# Patient Record
Sex: Male | Born: 1965 | Race: White | Hispanic: No | Smoking: Current every day smoker
Health system: Southern US, Community
[De-identification: ages and names within clinical notes are randomized; demographics above are authoritative.]

## PROBLEM LIST (undated history)

## (undated) DIAGNOSIS — I10 Essential (primary) hypertension: Secondary | ICD-10-CM

## (undated) DIAGNOSIS — E079 Disorder of thyroid, unspecified: Secondary | ICD-10-CM

---

## 2006-09-02 ENCOUNTER — Inpatient Hospital Stay (HOSPITAL_COMMUNITY): Admission: RE | Admit: 2006-09-02 | Discharge: 2006-09-06 | Payer: Self-pay | Admitting: Psychiatry

## 2006-09-02 ENCOUNTER — Emergency Department (HOSPITAL_COMMUNITY): Admission: EM | Admit: 2006-09-02 | Discharge: 2006-09-02 | Payer: Self-pay | Admitting: Emergency Medicine

## 2006-09-02 ENCOUNTER — Ambulatory Visit: Payer: Self-pay | Admitting: Psychiatry

## 2012-09-07 ENCOUNTER — Other Ambulatory Visit (HOSPITAL_COMMUNITY): Payer: Self-pay

## 2012-10-06 ENCOUNTER — Ambulatory Visit: Payer: BC Managed Care – PPO | Attending: Neurology | Admitting: Sleep Medicine

## 2012-10-06 DIAGNOSIS — G4730 Sleep apnea, unspecified: Secondary | ICD-10-CM

## 2012-10-06 DIAGNOSIS — Z6835 Body mass index (BMI) 35.0-35.9, adult: Secondary | ICD-10-CM | POA: Insufficient documentation

## 2012-10-06 DIAGNOSIS — G4733 Obstructive sleep apnea (adult) (pediatric): Secondary | ICD-10-CM | POA: Insufficient documentation

## 2012-10-21 NOTE — Procedures (Signed)
[facility] Maimouna Rondeau A. Gerilyn Pilgrim, MD     [email or web address]        NAMEJADIE, COMAS                   ACCOUNT NO.:  [id number]  MEDICAL RECORD NO.:  [id number]          PATIENT TYPE:  OUT  LOCATION:  SLEEP LAB                     FACILITY:  [facility]  PHYSICIAN:  Sheikh Leverich A. Gerilyn Pilgrim, M.D. DATE OF BIRTH:  January 07, 1966  DATE OF STUDY:  10/06/2012                           NOCTURNAL POLYSOMNOGRAM  REFERRING PHYSICIAN:  Kirklin Mcduffee A. Gerilyn Pilgrim, M.D.  INDICATION:  A 47 year old man, who presents with fatigue, hypersomnia, and witnessed apnea.  MEDICATIONS:  Lisinopril, Prilosec, levothyroxine, Xanax, and Paxil.  EPWORTH SLEEPINESS SCALE:  6.  BMI 35.  ARCHITECTURAL SUMMARY:  The total recording time is 388 minutes.  Sleep efficiency 50%.  Sleep latency 81 minutes.  REM latency 0 minutes. Stage N1 is 30%, N2 is 70%, N3 is 0%, and REM sleep 0%.  RESPIRATORY SUMMARY:  Baseline oxygen saturation is 97, lowest saturation 92.  Diagnostic AHI is 83 and RDI of 84.  LIMB MOVEMENT SUMMARY:  PLM index 0.  ELECTROCARDIOGRAM SUMMARY:  Average heart rate is 82 with no significant dysrhythmias observed.  IMPRESSION: 1. Severe obstructive sleep apnea syndrome. 2. Abnormal architecture with reduced sleep efficiency.  Absent slow     wave sleep and absent REM sleep.  RECOMMENDATION:  The patient should be set up for formal CPAP titration recording.     Ragan Reale A. Gerilyn Pilgrim, M.D.    KAD/MEDQ  D:  10/21/2012 10:40:31  T:  10/21/2012 11:57:00  Job:  [id number]

## 2012-11-09 ENCOUNTER — Other Ambulatory Visit: Payer: Self-pay | Admitting: Neurology

## 2012-11-09 DIAGNOSIS — G4733 Obstructive sleep apnea (adult) (pediatric): Secondary | ICD-10-CM

## 2012-11-10 ENCOUNTER — Ambulatory Visit: Payer: BC Managed Care – PPO | Attending: Neurology | Admitting: Sleep Medicine

## 2012-11-10 DIAGNOSIS — Z6835 Body mass index (BMI) 35.0-35.9, adult: Secondary | ICD-10-CM | POA: Insufficient documentation

## 2012-11-10 DIAGNOSIS — G4733 Obstructive sleep apnea (adult) (pediatric): Secondary | ICD-10-CM | POA: Insufficient documentation

## 2012-11-17 NOTE — Procedures (Signed)
[facility] [name], MD     [email or web address]        [name], [name]                   ACCOUNT NO.:  [id number]  MEDICAL RECORD NO.:  [id number]          PATIENT TYPE:  OUT  LOCATION:  SLEEP LAB                     FACILITY:  [facility]  PHYSICIAN:  Jonquil Stubbe A. Gerilyn Pilgrim, M.D. DATE OF BIRTH:  Aug 07, 1965  DATE OF STUDY:  11/10/2012                           NOCTURNAL POLYSOMNOGRAM  REFERRING PHYSICIAN:  Leeam Cedrone A. Gerilyn Pilgrim, M.D.  INDICATION FOR STUDY:  A 47 year old man who had a previous study documenting significant obstructive sleep apnea syndrome.  EPWORTH SLEEPINESS SCORE:  12.  BMI 35.  MEDICATIONS:  Xanax, levothyroxine, lisinopril, Paxil, Prilosec.  SLEEP ARCHITECTURE:  The total recording time is 119 minutes, sleep efficiency 40%, sleep latency 9 minutes, REM latency 0.  Study is abbreviated because the patient had several panic attacks, although he was desensitized before the study with CPAP equipment.  He simply could not tolerate the mask during the several attempts that were made throughout the recording.  In the meantime, he did have low saturation of 77 during non-REM sleep.  Baseline saturation 93.  He was tried on different pressors between 5 and 9, but again had multiple episodes of panic attack and could not tolerate the titration pressures.   IMPRESSIONS-RECOMMENDATIONS:  Unsuccessful titration and abbreviated study due to intolerance of CPAP with multiple episodes of panic attacks.     [name], M.D.   KAD/MEDQ  D:  11/17/2012 18:50:00  T:  11/17/2012 19:00:01  Job:  [id number]

## 2018-05-02 ENCOUNTER — Ambulatory Visit: Payer: Self-pay | Admitting: Psychiatry

## 2018-05-02 DIAGNOSIS — F329 Major depressive disorder, single episode, unspecified: Secondary | ICD-10-CM

## 2018-05-02 DIAGNOSIS — F32A Depression, unspecified: Secondary | ICD-10-CM

## 2018-05-02 DIAGNOSIS — G4700 Insomnia, unspecified: Secondary | ICD-10-CM

## 2018-05-02 DIAGNOSIS — F411 Generalized anxiety disorder: Secondary | ICD-10-CM

## 2018-05-02 MED ORDER — PAROXETINE HCL 20 MG PO TABS: 20.0000 mg | ORAL_TABLET | Freq: Every day | ORAL | 1 refills | Status: DC

## 2018-05-02 MED ORDER — TRAZODONE HCL 50 MG PO TABS: 100.0000 mg | ORAL_TABLET | Freq: Every day | ORAL | 1 refills | Status: DC

## 2018-05-02 NOTE — Progress Notes (Signed)
[facility]  Patient ID: Jacob Mullen,  MRN: [id number]  PCP: Ignatius Specking, MD  Date of Evaluation: 05/02/2018 Time spent:20 minutes   HISTORY/CURRENT STATUS: HPI 52 year old white male seen initially 03/31/2018.  Diagnosis depression, anxiety, insomnia, increased alcohol intake and hx.of drug use. At that time we increased his Paxil to 20 mg a day.  Started him on naltrexone and NAC for decrease alcohol consumption.  Advised to start a B complex vitamin.  Start melatonin for sleep.  He was also encouraged to decrease etoh and cigarettes. At this time patient does not have depression or anxiety.  He still complains of insomnia, melatonin did not help.  Continues to drink 3 or 4 beers a day 4 days a week to help with insomnia.  Did not start naltrexone.   Individual Medical History/ Review of Systems: Changes? :No  Allergies: Patient has no allergy information on record.  Current Medications:  Current Outpatient Medications:  .  PARoxetine (PAXIL) 20 MG tablet, Take 1 tablet (20 mg total) by mouth daily., Disp: 30 tablet, Rfl: 1 .  naltrexone (DEPADE) 50 MG tablet, Take 50 mg by mouth daily., Disp: , Rfl:  .  traZODone (DESYREL) 50 MG tablet, Take 2 tablets (100 mg total) by mouth at bedtime., Disp: 60 tablet, Rfl: 1 Medication Side Effects: None  Family Medical/ Social History: Changes? No  MENTAL HEALTH EXAM:  There were no vitals taken for this visit.There is no height or weight on file to calculate BMI.  General Appearance: Casual  Eye Contact:  Good  Speech:  Normal Rate  Volume:  Normal  Mood:  Euthymic  Affect:  Appropriate  Thought Process:  Linear  Orientation:  Full (Time, Place, and Person)  Thought Content: WDL   Suicidal Thoughts:  No  Homicidal Thoughts:  No  Memory:  normal  Judgement:  Good  Insight:  Good  Psychomotor Activity:  Normal  Concentration:  Concentration: Good  Recall:  Good  Fund of Knowledge: Good  Language: Good  Akathisia:   NA  AIMS (if indicated): na  Assets:  Desire for Improvement  ADL's:  Intact  Cognition: WNL  Prognosis:  Good    DIAGNOSES:    ICD-10-CM   1. Depression, unspecified depression type F32.9   2. Anxiety state F41.1   3. Insomnia, unspecified type G47.00     RECOMMENDATIONS: We continue Paxil 20 mg.  He is also started on trazodone 50 mg 1-2 at bedtime.  Advised to drink less.  We will to wait on naltrexone.  Will make sure that he is using B complex vitamin in the future.  He will return in 4 to 6 weeks.    Anne Fu, PA-C

## 2018-05-08 ENCOUNTER — Other Ambulatory Visit: Payer: Self-pay | Admitting: Psychiatry

## 2018-05-08 MED ORDER — PAROXETINE HCL 20 MG PO TABS: 20.0000 mg | ORAL_TABLET | Freq: Every day | ORAL | 1 refills | Status: DC

## 2018-05-24 ENCOUNTER — Other Ambulatory Visit: Payer: Self-pay

## 2018-05-24 MED ORDER — TRAZODONE HCL 50 MG PO TABS: 100.0000 mg | ORAL_TABLET | Freq: Every day | ORAL | 0 refills | Status: AC

## 2018-06-14 ENCOUNTER — Ambulatory Visit (INDEPENDENT_AMBULATORY_CARE_PROVIDER_SITE_OTHER): Payer: Self-pay | Admitting: Psychiatry

## 2018-06-14 DIAGNOSIS — F32A Depression, unspecified: Secondary | ICD-10-CM

## 2018-06-14 DIAGNOSIS — F329 Major depressive disorder, single episode, unspecified: Secondary | ICD-10-CM

## 2018-06-14 DIAGNOSIS — G4700 Insomnia, unspecified: Secondary | ICD-10-CM

## 2018-06-14 DIAGNOSIS — F411 Generalized anxiety disorder: Secondary | ICD-10-CM

## 2018-06-14 MED ORDER — MIRTAZAPINE 15 MG PO TABS: 30.0000 mg | ORAL_TABLET | Freq: Every day | ORAL | 0 refills | Status: AC

## 2018-06-14 MED ORDER — PAROXETINE HCL 30 MG PO TABS: ORAL_TABLET | ORAL | 0 refills | Status: DC

## 2018-06-14 NOTE — Progress Notes (Signed)
[facility]  Patient ID: Jacob Mullen,  MRN: [id number]  PCP: Ignatius SpeckingVyas, Dhruv B, MD  Date of Evaluation: 06/14/2018 Time spent:20 minutes  Chief Complaint:   HISTORY/CURRENT STATUS: HPI  Patient seen 05/02/2018 with diagnosis of depression (mood disorder), anxiety, insomnia, increased alcohol intake, history of drug use. Depression and anxiety were better with still having insomnia. Continue to his Paxil 20 mg a day and started him on trazodone for sleep.  Patient here today states he is depressed he has increased stress he denies crying spells but admits to isolation.  Insomnia is the same.  Anhedonia.  No suicidal thoughts. Anxiety is okay. Insomnia-patient still not sleeping well using Z quill and trazodone 100 250 at bedtime He is to decrease his alcohol 2 beers several days a week.  None since Thanksgiving.  Individual Medical History/ Review of Systems: Changes? no  Allergies: Codeine  Current Medications:  Current Outpatient Medications:  .  traZODone (DESYREL) 50 MG tablet, Take 2 tablets (100 mg total) by mouth at bedtime., Disp: 180 tablet, Rfl: 0 .  mirtazapine (REMERON) 15 MG tablet, Take 2 tablets (30 mg total) by mouth at bedtime., Disp: 60 tablet, Rfl: 0 .  PARoxetine (PAXIL) 30 MG tablet, 1 per day, Disp: 30 tablet, Rfl: 0 Medication Side Effects: none  Family Medical/ Social History: Changes?no  MENTAL HEALTH EXAM:  There were no vitals taken for this visit.There is no height or weight on file to calculate BMI.  General Appearance: Casual  Eye Contact:  Good  Speech:  Clear and Coherent  Volume:  Normal  Mood:  Depressed  Affect:  Appropriate  Thought Process:  Goal Directed  Orientation:  Full (Time, Place, and Person)  Thought Content: Logical   Suicidal Thoughts:  No  Homicidal Thoughts:  No  Memory:  WNL  Judgement:  Good  Insight:  Good  Psychomotor Activity:  Normal  Concentration:  Concentration: Good  Recall:  Good  Fund of  Knowledge: Good  Language: Good  Assets:  Desire for Improvement  ADL's:  Intact  Cognition: WNL  Prognosis:  Good    DIAGNOSES:    ICD-10-CM   1. Depression, unspecified depression type F32.9   2. Anxiety state F41.1   3. Insomnia, unspecified type G47.00     Receiving Psychotherapy: No    RECOMMENDATIONS: Patient is to increase his Paxil to 30 mg a day.  Patient is to take trazodone 50 mg a day for 1 week and then stop.  Patient to start Remeron 15 mg 1 to 2 tablets at bedtime.  At his next visit we will remind the patient to take B complex vitamins. Return in 1 month.   Anne Fulay Avni Traore, PA-C

## 2018-07-13 ENCOUNTER — Ambulatory Visit: Payer: Self-pay | Admitting: Psychiatry

## 2018-07-19 ENCOUNTER — Encounter (HOSPITAL_COMMUNITY): Payer: Self-pay

## 2018-07-19 ENCOUNTER — Emergency Department (HOSPITAL_COMMUNITY)
Admission: EM | Admit: 2018-07-19 | Discharge: 2018-07-20 | Disposition: A | Discharge status: Home / Self Care | Payer: Medicaid Other | Attending: Emergency Medicine | Admitting: Emergency Medicine

## 2018-07-19 DIAGNOSIS — Z79899 Other long term (current) drug therapy: Secondary | ICD-10-CM | POA: Insufficient documentation

## 2018-07-19 DIAGNOSIS — I10 Essential (primary) hypertension: Secondary | ICD-10-CM | POA: Insufficient documentation

## 2018-07-19 DIAGNOSIS — E079 Disorder of thyroid, unspecified: Secondary | ICD-10-CM | POA: Insufficient documentation

## 2018-07-19 DIAGNOSIS — R17 Unspecified jaundice: Secondary | ICD-10-CM | POA: Diagnosis present

## 2018-07-19 DIAGNOSIS — D649 Anemia, unspecified: Secondary | ICD-10-CM

## 2018-07-19 DIAGNOSIS — R945 Abnormal results of liver function studies: Secondary | ICD-10-CM | POA: Insufficient documentation

## 2018-07-19 DIAGNOSIS — R748 Abnormal levels of other serum enzymes: Secondary | ICD-10-CM

## 2018-07-19 DIAGNOSIS — F17210 Nicotine dependence, cigarettes, uncomplicated: Secondary | ICD-10-CM | POA: Diagnosis not present

## 2018-07-19 HISTORY — DX: Essential (primary) hypertension: I10

## 2018-07-19 HISTORY — DX: Disorder of thyroid, unspecified: E07.9

## 2018-07-19 LAB — URINALYSIS, ROUTINE W REFLEX MICROSCOPIC
Glucose, UA: 50 mg/dL — AB
Ketones, ur: NEGATIVE mg/dL
Leukocytes, UA: NEGATIVE
Nitrite: NEGATIVE
PH: 6 (ref 5.0–8.0)
Protein, ur: 30 mg/dL — AB
Specific Gravity, Urine: 1.017 (ref 1.005–1.030)

## 2018-07-19 LAB — COMPREHENSIVE METABOLIC PANEL
ALT: 51 U/L — ABNORMAL HIGH (ref 0–44)
AST: 98 U/L — ABNORMAL HIGH (ref 15–41)
Albumin: 2.6 g/dL — ABNORMAL LOW (ref 3.5–5.0)
Alkaline Phosphatase: 162 U/L — ABNORMAL HIGH (ref 38–126)
Anion gap: 9 (ref 5–15)
BILIRUBIN TOTAL: 26.8 mg/dL — AB (ref 0.3–1.2)
BUN: 5 mg/dL — ABNORMAL LOW (ref 6–20)
CO2: 24 mmol/L (ref 22–32)
Calcium: 9.1 mg/dL (ref 8.9–10.3)
Chloride: 99 mmol/L (ref 98–111)
Creatinine, Ser: 0.71 mg/dL (ref 0.61–1.24)
GFR calc Af Amer: >60 mL/min (ref >60)
GFR calc non Af Amer: >60 mL/min (ref >60)
Glucose, Bld: 121 mg/dL — ABNORMAL HIGH (ref 70–99)
POTASSIUM: 3.5 mmol/L (ref 3.5–5.1)
Sodium: 132 mmol/L — ABNORMAL LOW (ref 135–145)
TOTAL PROTEIN: 7.7 g/dL (ref 6.5–8.1)

## 2018-07-19 LAB — CBC
HCT: 36.1 % — ABNORMAL LOW (ref 39.0–52.0)
Hemoglobin: 12.5 g/dL — ABNORMAL LOW (ref 13.0–17.0)
MCH: 34 pg (ref 26.0–34.0)
MCHC: 34.6 g/dL (ref 30.0–36.0)
MCV: 98.1 fL (ref 80.0–100.0)
Platelets: 46 10*3/uL — ABNORMAL LOW (ref 150–400)
RBC: 3.68 MIL/uL — ABNORMAL LOW (ref 4.22–5.81)
RDW: 14.4 % (ref 11.5–15.5)
WBC: 4.9 10*3/uL (ref 4.0–10.5)
nRBC: 0 % (ref 0.0–0.2)

## 2018-07-19 LAB — LIPASE, BLOOD: Lipase: 45 U/L (ref 11–51)

## 2018-07-19 NOTE — ED Provider Notes (Signed)
[name] EMERGENCY DEPARTMENT Provider Note   CSN: [id number] Arrival date & time: 07/19/18  1947     History   Chief Complaint Chief Complaint  Patient presents with  . Jaundice    HPI Jacob Mullen is a 53 y.o. male.  The history is provided by the patient.  He has history of hypertension and thyroid disease as well as excessive alcohol use and comes in with yellow eyes and dark urine for the last 2 weeks.  He states that his appetite has been fine.  He denies any abdominal pain or nausea.  He has been a little bit weaker than normal.  He denies constipation or diarrhea.  He does relate that he takes paroxetine, and the dose was increased from 10 mg a day to 20 mg a day the beginning of December.  He admits to drinking 4 or 5 12 ounce beers a day.  He does relate that when he stops drinking, he feels shaky for 1 or 2 days, but has never had a seizure and is never been admitted for alcohol withdrawal.  He denies other drug use.  He denies fever chills.  Past Medical History:  Diagnosis Date  . Hypertension   . Thyroid disease     There are no active problems to display for this patient.   History reviewed. No pertinent surgical history.      Home Medications    Prior to Admission medications   Medication Sig Start Date End Date Taking? Authorizing Provider  mirtazapine (REMERON) 15 MG tablet Take 2 tablets (30 mg total) by mouth at bedtime. 06/14/18   Shugart, Mat Carne, PA-C  PARoxetine (PAXIL) 30 MG tablet 1 per day 06/14/18   Anne Fu, PA-C  traZODone (DESYREL) 50 MG tablet Take 2 tablets (100 mg total) by mouth at bedtime. 05/24/18   Anne Fu, PA-C    Family History No family history on file.  Social History Social History   Tobacco Use  . Smoking status: Current Every Day Smoker    Packs/day: 0.50  Substance Use Topics  . Alcohol use: Yes  . Drug use: Not on file     Allergies   Codeine   Review of Systems Review of Systems    All other systems reviewed and are negative.    Physical Exam Updated Vital Signs BP 131/76 (BP Location: Left Arm)   Pulse 88   Temp 98.2 F (36.8 C) (Oral)   Resp 14   SpO2 96%   Physical Exam Vitals signs and nursing note reviewed.    53 year old male, resting comfortably and in no acute distress. Vital signs are normal. Oxygen saturation is 96%, which is normal. Head is normocephalic and atraumatic. PERRLA, EOMI. Oropharynx is clear.  Intense scleral icterus is noted. Neck is nontender and supple without adenopathy or JVD. Back is nontender and there is no CVA tenderness. Lungs are clear without rales, wheezes, or rhonchi. Chest is nontender. Heart has regular rate and rhythm without murmur. Abdomen is soft, flat, nontender without masses or hepatosplenomegaly and peristalsis is normoactive.  Liver size is normal.  No right upper quadrant tenderness. Extremities have no cyanosis or edema, full range of motion is present. Skin is warm and dry without rash. Neurologic: Mental status is normal, cranial nerves are intact, there are no motor or sensory deficits.  ED Treatments / Results  Labs (all labs ordered are listed, but only abnormal results are displayed) Labs Reviewed  COMPREHENSIVE METABOLIC  PANEL - Abnormal; Notable for the following components:      Result Value   Sodium 132 (*)    Glucose, Bld 121 (*)    BUN 5 (*)    Albumin 2.6 (*)    AST 98 (*)    ALT 51 (*)    Alkaline Phosphatase 162 (*)    Total Bilirubin 26.8 (*)    All other components within normal limits  CBC - Abnormal; Notable for the following components:   RBC 3.68 (*)    Hemoglobin 12.5 (*)    HCT 36.1 (*)    Platelets 46 (*)    All other components within normal limits  URINALYSIS, ROUTINE W REFLEX MICROSCOPIC - Abnormal; Notable for the following components:   Color, Urine AMBER (*)    APPearance CLOUDY (*)    Glucose, UA 50 (*)    Hgb urine dipstick SMALL (*)    Bilirubin Urine  MODERATE (*)    Protein, ur 30 (*)    Bacteria, UA MANY (*)    Non Squamous Epithelial 0-5 (*)    All other components within normal limits  LIPASE, BLOOD   Radiology [place] Abdomen Limited  Result Date: 07/20/2018 CLINICAL DATA:  Jaundice. EXAM: ULTRASOUND ABDOMEN LIMITED RIGHT UPPER QUADRANT COMPARISON:  None. FINDINGS: Gallbladder: Partially distended and irregular in shape. No gallstones or wall thickening visualized. No sonographic Murphy sign noted by sonographer. Common bile duct: Diameter: 4 mm, normal. Liver: No focal lesion identified. Heterogeneous and increased in parenchymal echogenicity. Suggestion of mild micro nodular contours. Portal vein is patent on color Doppler imaging with normal direction of blood flow towards the liver. Small amount perihepatic ascites. IMPRESSION: 1. Heterogeneous increased hepatic echogenicity suggesting steatosis/chronic hepatocellular disease. Mild micro nodular contours of the liver suggests cirrhosis. Trace perihepatic ascites. 2. Gallbladder is partially distended, and irregular in shape, this may be due to chronic gallbladder disease. No gallstones or wall thickening. No biliary dilatation. Electronically Signed   By: Narda RutherfordMelanie  Sanford M.D.   On: 07/20/2018 01:51    Procedures Procedures  Medications Ordered in ED Medications - No data to display   Initial Impression / Assessment and Plan / ED Course  I have reviewed the triage vital signs and the nursing notes.  Pertinent labs & imaging results that were available during my care of the patient were reviewed by me and considered in my medical decision making (see chart for details).  Jaundice which is probably multifactorial.  Labs show bilirubin of 26.8, but transaminases are under 100, and alkaline phosphatase is only mildly elevated to 162.  Mild anemia is present which is normochromic and normocytic, no prior hemoglobin available for comparison.  Will check total bilirubin and send blood for  hepatitis studies.  Suspect combination of ethanol with drug-induced jaundiced.  Since he is eating well, no indication for hospitalization at this point.  Will check right upper quadrant ultrasound to look for evidence of hepatic duct obstruction.  Anticipate referral to gastroenterology.  Old records were reviewed, and he has no relevant past visits.  Ultrasound shows no evidence of obstruction.  He is advised to discontinue peroxide teen, since symptoms started shortly after a change in the dose of that medication.  He is advised to abstain from alcohol and acetaminophen.  He is referred to gastroenterology for further outpatient work-up.  Final Clinical Impressions(s) / ED Diagnoses   Final diagnoses:  Jaundice  Normochromic normocytic anemia  Elevated liver enzymes    ED Discharge Orders  None       Dione BoozeGlick, Bobby Barton, MD 07/20/18 0230

## 2018-07-19 NOTE — ED Triage Notes (Signed)
Pt reports yellow skin and eyes since christmas, pt states he does drink about 4 beers daily and has been a daily drinker for about 10 years. Pt denies abd pain, n/v.

## 2018-07-20 ENCOUNTER — Emergency Department (HOSPITAL_COMMUNITY): Payer: Medicaid Other

## 2018-07-20 LAB — BILIRUBIN, DIRECT: BILIRUBIN DIRECT: 16.2 mg/dL — AB (ref 0.0–0.2)

## 2018-07-20 NOTE — ED Notes (Signed)
thw pt went to ultrasound 0130 and returned to his room 0145

## 2018-07-20 NOTE — ED Notes (Signed)
The pt is c/o l;ooking jaundice for several weeks  No pain anywhere

## 2018-07-20 NOTE — Discharge Instructions (Addendum)
Do not drink anything with alcohol in it!  Do not  take any acetaminophen!  Stop taking Paxil.  Return if you are having any problems.  Your blood tests for hepatitis will not be available for several days - talk with your primary care provider or gastroenterologist to interpret the results.

## 2018-07-21 LAB — HEPATITIS PANEL, ACUTE
HCV Ab: 0.2 s/co ratio (ref 0.0–0.9)
Hep A IgM: NEGATIVE
Hep B C IgM: NEGATIVE
Hepatitis B Surface Ag: NEGATIVE

## 2018-07-28 ENCOUNTER — Telehealth (HOSPITAL_COMMUNITY): Payer: Self-pay

## 2018-07-31 ENCOUNTER — Telehealth: Payer: Self-pay | Admitting: Nurse Practitioner

## 2018-07-31 NOTE — Telephone Encounter (Signed)
Can we accept him as a new patient?  

## 2018-08-01 NOTE — Telephone Encounter (Signed)
Pt's mother is aware of OV tomorrow

## 2018-08-01 NOTE — Telephone Encounter (Signed)
No previous GI provider in the system. Ok to accept.

## 2018-08-02 ENCOUNTER — Inpatient Hospital Stay (HOSPITAL_COMMUNITY): Payer: Medicaid Other

## 2018-08-02 ENCOUNTER — Other Ambulatory Visit: Payer: Self-pay

## 2018-08-02 ENCOUNTER — Ambulatory Visit: Payer: Self-pay | Admitting: Gastroenterology

## 2018-08-02 ENCOUNTER — Inpatient Hospital Stay (HOSPITAL_COMMUNITY)
Admission: EM | Admit: 2018-08-02 | Discharge: 2018-08-12 | DRG: 432 | Disposition: E | Payer: Medicaid Other | Attending: Pulmonary Disease | Admitting: Pulmonary Disease

## 2018-08-02 DIAGNOSIS — N179 Acute kidney failure, unspecified: Secondary | ICD-10-CM | POA: Diagnosis not present

## 2018-08-02 DIAGNOSIS — Z452 Encounter for adjustment and management of vascular access device: Secondary | ICD-10-CM

## 2018-08-02 DIAGNOSIS — K7041 Alcoholic hepatic failure with coma: Secondary | ICD-10-CM | POA: Diagnosis present

## 2018-08-02 DIAGNOSIS — F419 Anxiety disorder, unspecified: Secondary | ICD-10-CM | POA: Diagnosis present

## 2018-08-02 DIAGNOSIS — A419 Sepsis, unspecified organism: Secondary | ICD-10-CM

## 2018-08-02 DIAGNOSIS — J690 Pneumonitis due to inhalation of food and vomit: Secondary | ICD-10-CM | POA: Diagnosis present

## 2018-08-02 DIAGNOSIS — K652 Spontaneous bacterial peritonitis: Secondary | ICD-10-CM

## 2018-08-02 DIAGNOSIS — D638 Anemia in other chronic diseases classified elsewhere: Secondary | ICD-10-CM | POA: Diagnosis present

## 2018-08-02 DIAGNOSIS — F329 Major depressive disorder, single episode, unspecified: Secondary | ICD-10-CM | POA: Diagnosis present

## 2018-08-02 DIAGNOSIS — Z515 Encounter for palliative care: Secondary | ICD-10-CM | POA: Diagnosis present

## 2018-08-02 DIAGNOSIS — Z66 Do not resuscitate: Secondary | ICD-10-CM | POA: Diagnosis present

## 2018-08-02 DIAGNOSIS — K7011 Alcoholic hepatitis with ascites: Secondary | ICD-10-CM | POA: Diagnosis present

## 2018-08-02 DIAGNOSIS — K210 Gastro-esophageal reflux disease with esophagitis: Secondary | ICD-10-CM | POA: Diagnosis present

## 2018-08-02 DIAGNOSIS — K766 Portal hypertension: Secondary | ICD-10-CM | POA: Diagnosis present

## 2018-08-02 DIAGNOSIS — K920 Hematemesis: Secondary | ICD-10-CM | POA: Diagnosis present

## 2018-08-02 DIAGNOSIS — N170 Acute kidney failure with tubular necrosis: Secondary | ICD-10-CM | POA: Diagnosis present

## 2018-08-02 DIAGNOSIS — G9341 Metabolic encephalopathy: Secondary | ICD-10-CM | POA: Diagnosis present

## 2018-08-02 DIAGNOSIS — K7031 Alcoholic cirrhosis of liver with ascites: Secondary | ICD-10-CM | POA: Diagnosis present

## 2018-08-02 DIAGNOSIS — R6521 Severe sepsis with septic shock: Secondary | ICD-10-CM

## 2018-08-02 DIAGNOSIS — E440 Moderate protein-calorie malnutrition: Secondary | ICD-10-CM | POA: Diagnosis present

## 2018-08-02 DIAGNOSIS — E875 Hyperkalemia: Secondary | ICD-10-CM | POA: Diagnosis present

## 2018-08-02 DIAGNOSIS — R4182 Altered mental status, unspecified: Secondary | ICD-10-CM

## 2018-08-02 DIAGNOSIS — D688 Other specified coagulation defects: Secondary | ICD-10-CM | POA: Diagnosis present

## 2018-08-02 DIAGNOSIS — Z978 Presence of other specified devices: Secondary | ICD-10-CM

## 2018-08-02 DIAGNOSIS — D696 Thrombocytopenia, unspecified: Secondary | ICD-10-CM | POA: Diagnosis present

## 2018-08-02 DIAGNOSIS — K228 Other specified diseases of esophagus: Secondary | ICD-10-CM | POA: Diagnosis present

## 2018-08-02 DIAGNOSIS — K767 Hepatorenal syndrome: Secondary | ICD-10-CM | POA: Diagnosis present

## 2018-08-02 DIAGNOSIS — K7290 Hepatic failure, unspecified without coma: Secondary | ICD-10-CM

## 2018-08-02 DIAGNOSIS — E079 Disorder of thyroid, unspecified: Secondary | ICD-10-CM | POA: Diagnosis present

## 2018-08-02 DIAGNOSIS — R34 Anuria and oliguria: Secondary | ICD-10-CM | POA: Diagnosis present

## 2018-08-02 DIAGNOSIS — F10288 Alcohol dependence with other alcohol-induced disorder: Secondary | ICD-10-CM | POA: Diagnosis present

## 2018-08-02 DIAGNOSIS — F17210 Nicotine dependence, cigarettes, uncomplicated: Secondary | ICD-10-CM | POA: Diagnosis present

## 2018-08-02 DIAGNOSIS — Z4659 Encounter for fitting and adjustment of other gastrointestinal appliance and device: Secondary | ICD-10-CM

## 2018-08-02 DIAGNOSIS — E873 Alkalosis: Secondary | ICD-10-CM | POA: Diagnosis present

## 2018-08-02 DIAGNOSIS — Z6828 Body mass index (BMI) 28.0-28.9, adult: Secondary | ICD-10-CM

## 2018-08-02 DIAGNOSIS — K7201 Acute and subacute hepatic failure with coma: Secondary | ICD-10-CM | POA: Diagnosis not present

## 2018-08-02 DIAGNOSIS — J9690 Respiratory failure, unspecified, unspecified whether with hypoxia or hypercapnia: Secondary | ICD-10-CM

## 2018-08-02 DIAGNOSIS — K7682 Hepatic encephalopathy: Secondary | ICD-10-CM | POA: Diagnosis present

## 2018-08-02 DIAGNOSIS — E871 Hypo-osmolality and hyponatremia: Secondary | ICD-10-CM | POA: Diagnosis present

## 2018-08-02 DIAGNOSIS — Z789 Other specified health status: Secondary | ICD-10-CM

## 2018-08-02 DIAGNOSIS — Z79899 Other long term (current) drug therapy: Secondary | ICD-10-CM

## 2018-08-02 DIAGNOSIS — K7040 Alcoholic hepatic failure without coma: Secondary | ICD-10-CM | POA: Diagnosis present

## 2018-08-02 DIAGNOSIS — Z7189 Other specified counseling: Secondary | ICD-10-CM | POA: Diagnosis not present

## 2018-08-02 DIAGNOSIS — J9601 Acute respiratory failure with hypoxia: Secondary | ICD-10-CM | POA: Diagnosis present

## 2018-08-02 DIAGNOSIS — R578 Other shock: Secondary | ICD-10-CM | POA: Diagnosis not present

## 2018-08-02 DIAGNOSIS — Z885 Allergy status to narcotic agent status: Secondary | ICD-10-CM

## 2018-08-02 DIAGNOSIS — I10 Essential (primary) hypertension: Secondary | ICD-10-CM | POA: Diagnosis present

## 2018-08-02 LAB — POCT I-STAT 7, (LYTES, BLD GAS, ICA,H+H)
Acid-base deficit: 2 mmol/L (ref 0.0–2.0)
Bicarbonate: 20 mmol/L (ref 20.0–28.0)
Calcium, Ion: 0.92 mmol/L — ABNORMAL LOW (ref 1.15–1.40)
HCT: 30 % — ABNORMAL LOW (ref 39.0–52.0)
Hemoglobin: 10.2 g/dL — ABNORMAL LOW (ref 13.0–17.0)
O2 Saturation: 100 %
Patient temperature: 97.5
Potassium: 4.7 mmol/L (ref 3.5–5.1)
SODIUM: 124 mmol/L — AB (ref 135–145)
TCO2: 21 mmol/L — ABNORMAL LOW (ref 22–32)
pCO2 arterial: 24.9 mmHg — ABNORMAL LOW (ref 32.0–48.0)
pH, Arterial: 7.51 — ABNORMAL HIGH (ref 7.350–7.450)
pO2, Arterial: 364 mmHg — ABNORMAL HIGH (ref 83.0–108.0)

## 2018-08-02 LAB — BASIC METABOLIC PANEL
Anion gap: 19 — ABNORMAL HIGH (ref 5–15)
Anion gap: 20 — ABNORMAL HIGH (ref 5–15)
Anion gap: 27 — ABNORMAL HIGH (ref 5–15)
BUN: 64 mg/dL — ABNORMAL HIGH (ref 6–20)
BUN: 70 mg/dL — ABNORMAL HIGH (ref 6–20)
BUN: 71 mg/dL — ABNORMAL HIGH (ref 6–20)
BUN: 75 mg/dL — ABNORMAL HIGH (ref 6–20)
CALCIUM: 8.4 mg/dL — AB (ref 8.9–10.3)
CALCIUM: 7.2 mg/dL — AB (ref 8.9–10.3)
CHLORIDE: 85 mmol/L — AB (ref 98–111)
CO2: 11 mmol/L — ABNORMAL LOW (ref 22–32)
CO2: 16 mmol/L — ABNORMAL LOW (ref 22–32)
CO2: 16 mmol/L — ABNORMAL LOW (ref 22–32)
CO2: 14 mmol/L — ABNORMAL LOW (ref 22–32)
CREATININE: 4.91 mg/dL — AB (ref 0.61–1.24)
CREATININE: 6.46 mg/dL — AB (ref 0.61–1.24)
CREATININE: 6.84 mg/dL — AB (ref 0.61–1.24)
Calcium: 7.3 mg/dL — ABNORMAL LOW (ref 8.9–10.3)
Calcium: 8.2 mg/dL — ABNORMAL LOW (ref 8.9–10.3)
Chloride: 86 mmol/L — ABNORMAL LOW (ref 98–111)
Chloride: 85 mmol/L — ABNORMAL LOW (ref 98–111)
Chloride: 86 mmol/L — ABNORMAL LOW (ref 98–111)
Creatinine, Ser: 6.45 mg/dL — ABNORMAL HIGH (ref 0.61–1.24)
GFR calc Af Amer: 14 mL/min — ABNORMAL LOW (ref >60)
GFR calc Af Amer: 10 mL/min — ABNORMAL LOW (ref >60)
GFR calc Af Amer: 10 mL/min — ABNORMAL LOW (ref >60)
GFR calc Af Amer: 10 mL/min — ABNORMAL LOW (ref >60)
GFR calc non Af Amer: 12 mL/min — ABNORMAL LOW (ref >60)
GFR calc non Af Amer: 9 mL/min — ABNORMAL LOW (ref >60)
GFR calc non Af Amer: 9 mL/min — ABNORMAL LOW (ref >60)
GFR calc non Af Amer: 8 mL/min — ABNORMAL LOW (ref >60)
Glucose, Bld: 120 mg/dL — ABNORMAL HIGH (ref 70–99)
Glucose, Bld: 85 mg/dL (ref 70–99)
Glucose, Bld: 84 mg/dL (ref 70–99)
Glucose, Bld: 91 mg/dL (ref 70–99)
Potassium: 5.5 mmol/L — ABNORMAL HIGH (ref 3.5–5.1)
Potassium: >7.5 mmol/L (ref 3.5–5.1)
Potassium: >7.5 mmol/L (ref 3.5–5.1)
Potassium: 4 mmol/L (ref 3.5–5.1)
SODIUM: 121 mmol/L — AB (ref 135–145)
Sodium: 122 mmol/L — ABNORMAL LOW (ref 135–145)
Sodium: 120 mmol/L — ABNORMAL LOW (ref 135–145)
Sodium: 127 mmol/L — ABNORMAL LOW (ref 135–145)

## 2018-08-02 LAB — GLUCOSE, CAPILLARY
Glucose-Capillary: 83 mg/dL (ref 70–99)
Glucose-Capillary: 77 mg/dL (ref 70–99)
Glucose-Capillary: 117 mg/dL — ABNORMAL HIGH (ref 70–99)
Glucose-Capillary: 83 mg/dL (ref 70–99)

## 2018-08-02 LAB — COMPREHENSIVE METABOLIC PANEL
ALK PHOS: 141 U/L — AB (ref 38–126)
ALT: 54 U/L — ABNORMAL HIGH (ref 0–44)
AST: 98 U/L — ABNORMAL HIGH (ref 15–41)
Albumin: 2.2 g/dL — ABNORMAL LOW (ref 3.5–5.0)
BUN: 65 mg/dL — ABNORMAL HIGH (ref 6–20)
CO2: 13 mmol/L — ABNORMAL LOW (ref 22–32)
Calcium: 8.4 mg/dL — ABNORMAL LOW (ref 8.9–10.3)
Chloride: 84 mmol/L — ABNORMAL LOW (ref 98–111)
Creatinine, Ser: 5.64 mg/dL — ABNORMAL HIGH (ref 0.61–1.24)
GFR calc Af Amer: 12 mL/min — ABNORMAL LOW (ref >60)
GFR calc non Af Amer: 10 mL/min — ABNORMAL LOW (ref >60)
Glucose, Bld: 122 mg/dL — ABNORMAL HIGH (ref 70–99)
Potassium: 3.9 mmol/L (ref 3.5–5.1)
Sodium: 122 mmol/L — ABNORMAL LOW (ref 135–145)
TOTAL PROTEIN: 6.7 g/dL (ref 6.5–8.1)
Total Bilirubin: 40.7 mg/dL (ref 0.3–1.2)

## 2018-08-02 LAB — CBC WITH DIFFERENTIAL/PLATELET
Abs Immature Granulocytes: 0.46 10*3/uL — ABNORMAL HIGH (ref 0.00–0.07)
Basophils Absolute: 0.1 10*3/uL (ref 0.0–0.1)
Basophils Relative: 0 %
EOS PCT: 0 %
Eosinophils Absolute: 0 10*3/uL (ref 0.0–0.5)
HCT: 28.8 % — ABNORMAL LOW (ref 39.0–52.0)
Hemoglobin: 10 g/dL — ABNORMAL LOW (ref 13.0–17.0)
Immature Granulocytes: 2 %
Lymphocytes Relative: 4 %
Lymphs Abs: 0.8 10*3/uL (ref 0.7–4.0)
MCH: 34.4 pg — ABNORMAL HIGH (ref 26.0–34.0)
MCHC: 34.7 g/dL (ref 30.0–36.0)
MCV: 99 fL (ref 80.0–100.0)
Monocytes Absolute: 2.2 10*3/uL — ABNORMAL HIGH (ref 0.1–1.0)
Monocytes Relative: 11 %
Neutro Abs: 16.6 10*3/uL — ABNORMAL HIGH (ref 1.7–7.7)
Neutrophils Relative %: 83 %
Platelets: 85 10*3/uL — ABNORMAL LOW (ref 150–400)
RBC: 2.91 MIL/uL — ABNORMAL LOW (ref 4.22–5.81)
RDW: 15.5 % (ref 11.5–15.5)
WBC: 20.1 10*3/uL — ABNORMAL HIGH (ref 4.0–10.5)
nRBC: 0 % (ref 0.0–0.2)

## 2018-08-02 LAB — ACETAMINOPHEN LEVEL: Acetaminophen (Tylenol), Serum: <10 ug/mL — ABNORMAL LOW (ref 10–30)

## 2018-08-02 LAB — LACTIC ACID, PLASMA
LACTIC ACID, VENOUS: 10.7 mmol/L — AB (ref 0.5–1.9)
Lactic Acid, Venous: 8.9 mmol/L (ref 0.5–1.9)
Lactic Acid, Venous: 10.6 mmol/L (ref 0.5–1.9)

## 2018-08-02 LAB — PROTIME-INR
INR: 3.08
INR: 3.43
PROTHROMBIN TIME: 34 s — AB (ref 11.4–15.2)
Prothrombin Time: 31.3 seconds — ABNORMAL HIGH (ref 11.4–15.2)

## 2018-08-02 LAB — ABO/RH: ABO/RH(D): O POS

## 2018-08-02 LAB — SODIUM, URINE, RANDOM: Sodium, Ur: <10 mmol/L

## 2018-08-02 LAB — PROCALCITONIN: Procalcitonin: 2.7 ng/mL

## 2018-08-02 LAB — CREATININE, URINE, RANDOM: Creatinine, Urine: 272.27 mg/dL

## 2018-08-02 LAB — MAGNESIUM: Magnesium: 2 mg/dL (ref 1.7–2.4)

## 2018-08-02 LAB — CORTISOL: Cortisol, Plasma: 21.8 ug/dL

## 2018-08-02 LAB — CBG MONITORING, ED: Glucose-Capillary: 114 mg/dL — ABNORMAL HIGH (ref 70–99)

## 2018-08-02 LAB — PROTEIN / CREATININE RATIO, URINE
Creatinine, Urine: 270.56 mg/dL
Protein Creatinine Ratio: 0.41 mg/mg{Cre} — ABNORMAL HIGH (ref 0.00–0.15)
Total Protein, Urine: 110 mg/dL

## 2018-08-02 LAB — AMMONIA: Ammonia: 324 umol/L — ABNORMAL HIGH (ref 9–35)

## 2018-08-02 LAB — PHOSPHORUS: Phosphorus: 11.3 mg/dL — ABNORMAL HIGH (ref 2.5–4.6)

## 2018-08-02 LAB — OSMOLALITY: Osmolality: 302 mOsm/kg — ABNORMAL HIGH (ref 275–295)

## 2018-08-02 LAB — OSMOLALITY, URINE: Osmolality, Ur: 314 mOsm/kg (ref 300–900)

## 2018-08-02 LAB — LIPASE, BLOOD: Lipase: 86 U/L — ABNORMAL HIGH (ref 11–51)

## 2018-08-02 LAB — AMYLASE: Amylase: 331 U/L — ABNORMAL HIGH (ref 28–100)

## 2018-08-02 MED ORDER — FENTANYL CITRATE (PF) 100 MCG/2ML IJ SOLN
100.0000 ug | INTRAMUSCULAR | Status: AC | PRN
  Administered 2018-08-02 – 2018-08-04 (×3): 100 ug via INTRAVENOUS
  Filled 2018-08-02 (×2): qty 2

## 2018-08-02 MED ORDER — SODIUM BICARBONATE 8.4 % IV SOLN
50.0000 meq | Freq: Once | INTRAVENOUS | Status: AC
  Administered 2018-08-02: 50 meq via INTRAVENOUS
  Filled 2018-08-02: qty 50

## 2018-08-02 MED ORDER — LORAZEPAM 2 MG/ML IJ SOLN
0.5000 mg | Freq: Once | INTRAMUSCULAR | Status: AC
  Administered 2018-08-02: 0.5 mg via INTRAVENOUS
  Filled 2018-08-02: qty 1

## 2018-08-02 MED ORDER — SODIUM CHLORIDE 0.9 % IV BOLUS
500.0000 mL | Freq: Once | INTRAVENOUS | Status: AC
  Administered 2018-08-02: 500 mL via INTRAVENOUS

## 2018-08-02 MED ORDER — RIFAXIMIN 550 MG PO TABS
550.0000 mg | ORAL_TABLET | Freq: Two times a day (BID) | ORAL | Status: DC
  Administered 2018-08-02 – 2018-08-06 (×7): 550 mg
  Filled 2018-08-02 (×8): qty 1

## 2018-08-02 MED ORDER — INSULIN ASPART 100 UNIT/ML IV SOLN
10.0000 [IU] | Freq: Once | INTRAVENOUS | Status: AC
  Administered 2018-08-02: 10 [IU] via INTRAVENOUS

## 2018-08-02 MED ORDER — ALBUMIN HUMAN 25 % IV SOLN
100.0000 g | Freq: Once | INTRAVENOUS | Status: AC
  Administered 2018-08-03: 100 g via INTRAVENOUS
  Filled 2018-08-02: qty 400

## 2018-08-02 MED ORDER — PANTOPRAZOLE SODIUM 40 MG IV SOLR
40.0000 mg | Freq: Every day | INTRAVENOUS | Status: DC
  Administered 2018-08-02: 40 mg via INTRAVENOUS
  Filled 2018-08-02: qty 40

## 2018-08-02 MED ORDER — ORAL CARE MOUTH RINSE
15.0000 mL | OROMUCOSAL | Status: DC
  Administered 2018-08-02 – 2018-08-06 (×35): 15 mL via OROMUCOSAL

## 2018-08-02 MED ORDER — FENTANYL 2500MCG IN NS 250ML (10MCG/ML) PREMIX INFUSION: 0.0000 ug/h | INTRAVENOUS | Status: DC

## 2018-08-02 MED ORDER — FENTANYL CITRATE (PF) 100 MCG/2ML IJ SOLN
INTRAMUSCULAR | Status: AC
  Filled 2018-08-02: qty 2

## 2018-08-02 MED ORDER — FENTANYL CITRATE (PF) 100 MCG/2ML IJ SOLN
100.0000 ug | INTRAMUSCULAR | Status: DC | PRN
  Administered 2018-08-03 – 2018-08-04 (×5): 100 ug via INTRAVENOUS
  Filled 2018-08-02 (×5): qty 2

## 2018-08-02 MED ORDER — PIPERACILLIN-TAZOBACTAM IN DEX 2-0.25 GM/50ML IV SOLN
2.2500 g | Freq: Three times a day (TID) | INTRAVENOUS | Status: DC
  Administered 2018-08-03: 2.25 g via INTRAVENOUS
  Filled 2018-08-02 (×2): qty 50

## 2018-08-02 MED ORDER — CALCIUM CHLORIDE 10 % IV SOLN: 1.0000 g | Freq: Once | INTRAVENOUS | Status: DC

## 2018-08-02 MED ORDER — DEXMEDETOMIDINE HCL IN NACL 400 MCG/100ML IV SOLN: 0.4000 ug/kg/h | INTRAVENOUS | Status: DC

## 2018-08-02 MED ORDER — VANCOMYCIN HCL 10 G IV SOLR
2000.0000 mg | Freq: Once | INTRAVENOUS | Status: AC
  Administered 2018-08-02: 2000 mg via INTRAVENOUS
  Filled 2018-08-02: qty 2000

## 2018-08-02 MED ORDER — DEXMEDETOMIDINE HCL IN NACL 400 MCG/100ML IV SOLN
0.0000 ug/kg/h | INTRAVENOUS | Status: AC
  Administered 2018-08-02 (×2): 0.6 ug/kg/h via INTRAVENOUS
  Administered 2018-08-03: 0.8 ug/kg/h via INTRAVENOUS
  Administered 2018-08-03: 0.7 ug/kg/h via INTRAVENOUS
  Administered 2018-08-03: 0.6 ug/kg/h via INTRAVENOUS
  Administered 2018-08-03: 0.8 ug/kg/h via INTRAVENOUS
  Administered 2018-08-04: 0.6 ug/kg/h via INTRAVENOUS
  Administered 2018-08-04: 1 ug/kg/h via INTRAVENOUS
  Administered 2018-08-04: 0.5 ug/kg/h via INTRAVENOUS
  Administered 2018-08-05: 0.702 ug/kg/h via INTRAVENOUS
  Administered 2018-08-05: 1.001 ug/kg/h via INTRAVENOUS
  Filled 2018-08-02 (×4): qty 100
  Filled 2018-08-02: qty 200
  Filled 2018-08-02 (×6): qty 100

## 2018-08-02 MED ORDER — RIFAXIMIN 550 MG PO TABS: 550.0000 mg | ORAL_TABLET | Freq: Three times a day (TID) | ORAL | Status: DC

## 2018-08-02 MED ORDER — MIDAZOLAM HCL 2 MG/2ML IJ SOLN
INTRAMUSCULAR | Status: AC
  Filled 2018-08-02: qty 2

## 2018-08-02 MED ORDER — SODIUM CHLORIDE 0.9 % IV SOLN: INTRAVENOUS | Status: DC

## 2018-08-02 MED ORDER — SODIUM CHLORIDE 0.9 % IV SOLN
2.0000 g | Freq: Once | INTRAVENOUS | Status: AC
  Administered 2018-08-02: 2 g via INTRAVENOUS
  Filled 2018-08-02: qty 2

## 2018-08-02 MED ORDER — SODIUM CHLORIDE 0.9% IV SOLUTION: Freq: Once | INTRAVENOUS | Status: AC

## 2018-08-02 MED ORDER — MIDAZOLAM HCL 2 MG/2ML IJ SOLN
2.0000 mg | Freq: Once | INTRAMUSCULAR | Status: AC
  Administered 2018-08-02: 2 mg via INTRAVENOUS

## 2018-08-02 MED ORDER — FENTANYL CITRATE (PF) 100 MCG/2ML IJ SOLN: 100.0000 ug | INTRAMUSCULAR | Status: DC | PRN

## 2018-08-02 MED ORDER — THIAMINE HCL 100 MG/ML IJ SOLN
100.0000 mg | Freq: Every day | INTRAMUSCULAR | Status: DC
  Administered 2018-08-02 – 2018-08-06 (×5): 100 mg via INTRAVENOUS
  Filled 2018-08-02 (×5): qty 2

## 2018-08-02 MED ORDER — NOREPINEPHRINE-SODIUM CHLORIDE 4-0.9 MG/250ML-% IV SOLN
INTRAVENOUS | Status: AC
  Filled 2018-08-02: qty 250

## 2018-08-02 MED ORDER — NOREPINEPHRINE-SODIUM CHLORIDE 4-0.9 MG/250ML-% IV SOLN
0.0000 ug/min | INTRAVENOUS | Status: DC
  Administered 2018-08-02: 2 ug/min via INTRAVENOUS

## 2018-08-02 MED ORDER — PIPERACILLIN-TAZOBACTAM 3.375 G IVPB 30 MIN
3.3750 g | Freq: Once | INTRAVENOUS | Status: AC
  Administered 2018-08-02: 3.375 g via INTRAVENOUS
  Filled 2018-08-02: qty 50

## 2018-08-02 MED ORDER — SODIUM CHLORIDE 0.9 % IV SOLN
1.0000 g | INTRAVENOUS | Status: AC
  Administered 2018-08-02: 1 g via INTRAVENOUS
  Filled 2018-08-02: qty 10

## 2018-08-02 MED ORDER — LACTULOSE 10 GM/15ML PO SOLN
30.0000 g | Freq: Three times a day (TID) | ORAL | Status: DC
  Administered 2018-08-02 – 2018-08-06 (×11): 30 g
  Filled 2018-08-02 (×11): qty 45

## 2018-08-02 MED ORDER — METRONIDAZOLE IN NACL 5-0.79 MG/ML-% IV SOLN
500.0000 mg | Freq: Three times a day (TID) | INTRAVENOUS | Status: DC
  Administered 2018-08-02: 500 mg via INTRAVENOUS
  Filled 2018-08-02: qty 100

## 2018-08-02 MED ORDER — CHLORHEXIDINE GLUCONATE 0.12% ORAL RINSE (MEDLINE KIT)
15.0000 mL | Freq: Two times a day (BID) | OROMUCOSAL | Status: DC
  Administered 2018-08-02 – 2018-08-06 (×8): 15 mL via OROMUCOSAL

## 2018-08-02 MED ORDER — SODIUM BICARBONATE 8.4 % IV SOLN
INTRAVENOUS | Status: AC
  Filled 2018-08-02: qty 50

## 2018-08-02 MED ORDER — SODIUM BICARBONATE 8.4 % IV SOLN
50.0000 meq | Freq: Once | INTRAVENOUS | Status: AC
  Administered 2018-08-02: 50 meq via INTRAVENOUS

## 2018-08-02 MED ORDER — INSULIN REGULAR NICU BOLUS VIA INFUSION: 0.0500 [IU]/kg | Freq: Once | INTRAVENOUS | Status: DC

## 2018-08-02 MED ORDER — DEXTROSE 50 % IV SOLN
1.0000 | Freq: Once | INTRAVENOUS | Status: AC
  Administered 2018-08-02: 50 mL via INTRAVENOUS
  Filled 2018-08-02: qty 50

## 2018-08-02 MED ORDER — NOREPINEPHRINE 16 MG/250ML-% IV SOLN
0.0000 ug/min | INTRAVENOUS | Status: DC
  Administered 2018-08-02: 4 ug/min via INTRAVENOUS
  Administered 2018-08-03: 22 ug/min via INTRAVENOUS
  Administered 2018-08-04: 12 ug/min via INTRAVENOUS
  Administered 2018-08-05 – 2018-08-06 (×2): 20 ug/min via INTRAVENOUS
  Filled 2018-08-02 (×5): qty 250

## 2018-08-02 MED ORDER — STERILE WATER FOR INJECTION IV SOLN
INTRAVENOUS | Status: DC
  Administered 2018-08-02 – 2018-08-03 (×3): via INTRAVENOUS
  Filled 2018-08-02 (×4): qty 850

## 2018-08-02 MED ORDER — ETOMIDATE 2 MG/ML IV SOLN
20.0000 mg | Freq: Once | INTRAVENOUS | Status: AC
  Administered 2018-08-02: 20 mg via INTRAVENOUS

## 2018-08-02 MED ORDER — ROCURONIUM BROMIDE 50 MG/5ML IV SOLN
50.0000 mg | Freq: Once | INTRAVENOUS | Status: AC
  Administered 2018-08-02: 50 mg via INTRAVENOUS
  Filled 2018-08-02 (×2): qty 5

## 2018-08-02 MED ORDER — FENTANYL CITRATE (PF) 100 MCG/2ML IJ SOLN
100.0000 ug | Freq: Once | INTRAMUSCULAR | Status: AC
  Administered 2018-08-02: 100 ug via INTRAVENOUS

## 2018-08-02 MED ORDER — VANCOMYCIN HCL IN DEXTROSE 1-5 GM/200ML-% IV SOLN: 1000.0000 mg | Freq: Once | INTRAVENOUS | Status: DC

## 2018-08-02 MED ORDER — FOLIC ACID 5 MG/ML IJ SOLN
1.0000 mg | Freq: Every day | INTRAMUSCULAR | Status: DC
  Administered 2018-08-03 – 2018-08-06 (×4): 1 mg via INTRAVENOUS
  Filled 2018-08-02 (×7): qty 0.2

## 2018-08-02 MED ORDER — SODIUM BICARBONATE 8.4 % IV SOLN: 50.0000 meq | Freq: Once | INTRAVENOUS | Status: AC

## 2018-08-02 MED ORDER — CALCIUM GLUCONATE 10 % IV SOLN: 1.0000 g | Freq: Once | INTRAVENOUS | Status: DC

## 2018-08-02 NOTE — Progress Notes (Signed)
eLink Physician-Brief Progress Note Patient Name: Jacob Mullen DOB: 07-14-1965 MRN: [id number]   Date of Service  08/05/2018  HPI/Events of Note  K+ 7.5, recently 4.7, repeat is elevated  eICU Interventions  Hyperkalemia protocol with iv calcium, insulin, D 50, Bicarb. Pt is for CRRT following line placement. Repeat K+ following intervention.        Migdalia Dk 07/14/2018, 8:36 PM

## 2018-08-02 NOTE — Procedures (Signed)
Central Venous Catheter Insertion Procedure Note WILMON CRITCHER [id number] December 23, 1965  Procedure: Insertion of Central Venous Catheter Indications: Assessment of intravascular volume and Drug and/or fluid administration  Procedure Details Consent: Unable to obtain consent because of emergent medical necessity. Time Out: Verified patient identification, verified procedure, site/side was marked, verified correct patient position, special equipment/implants available, medications/allergies/relevent history reviewed, required imaging and test results available.  Performed  Maximum sterile technique was used including antiseptics, cap, gloves, gown, hand hygiene, mask and sheet. Skin prep: Chlorhexidine; local anesthetic administered A antimicrobial bonded/coated triple lumen catheter was placed in the left internal jugular vein using the Seldinger technique.  Evaluation Blood flow good Complications: No apparent complications Patient did tolerate procedure well. Chest X-ray ordered to verify placement.  CXR: pending.  U/S used in placement.  Jaisha Villacres 07/27/2018, 3:58 PM

## 2018-08-02 NOTE — H&P (View-Only) (Signed)
UNASSIGNED PATIENT Reason for Consult: Jaundice with encephaopathy. Referring Physician: CCM.  Jacob Mullen is an 53 y.o. male.  HPI: Jacob Mullen is a 53 year old white male who presented to the emergency room initially on 07/19/2018 with complaints of jaundice, worsening of his anorexia and progressive weakness. She had been drinking 6 of the 12 ounce beers per day and was found to have elevated LFTs and abdominal ultrasound was negative for obstruction was advised to see a gastroenterologist an outpatient basis. He presented the and [facility] emergency room today with altered mental status and severe jaundice and was brought to the emergency room for further evaluation and treatment after being transferred from [facility]. And reviewing his records his bilirubin had gone up 240.7 with alkaline phosphatase of 141 and a sodium of 122 with creatinine of 5.6 and albumin is 65. He was admitted to the ICU because of his profound metabolic derangements. He vomited a little bit after admission and was intubated to protect his airway and was started on broad-spectrum antibiotics. GI consultation was procured to see the patient would benefit from steroids for alcoholic hepatitis.   Past Medical History:  Diagnosis Date  . Hypertension   . Thyroid disease    No past surgical history on file.  No family history on file.  Social History:  reports that he has been smoking. He has been smoking about 0.50 packs per day. He does not have any smokeless tobacco history on file. He reports current alcohol use. No history on file for drug.  Allergies:  Allergies  Allergen Reactions  . Codeine Itching   Medications: I have reviewed the patient's current medications.  Results for orders placed or performed during the hospital encounter of 07/23/2018 (from the past 48 hour(s))  Basic metabolic panel     Status: Abnormal   Collection Time: 07/25/2018 10:10 AM  Result Value Ref Range   Sodium 122 (L) 135  - 145 mmol/L   Potassium 5.5 (H) 3.5 - 5.1 mmol/L   Chloride 86 (L) 98 - 111 mmol/L   CO2 11 (L) 22 - 32 mmol/L   Glucose, Bld 120 (H) 70 - 99 mg/dL   BUN 64 (H) 6 - 20 mg/dL   Creatinine, Ser 5.784.91 (H) 0.61 - 1.24 mg/dL   Calcium 8.4 (L) 8.9 - 10.3 mg/dL   GFR calc non Af Amer 12 (L) >60 mL/min   GFR calc Af Amer 14 (L) >60 mL/min    Comment: Performed at [facility], [address] [place], [place] [address]  Ammonia     Status: Abnormal   Collection Time: 07/30/2018 10:10 AM  Result Value Ref Range   Ammonia 324 (H) 9 - 35 umol/L    Comment: Performed at [facility], [address] [place], [place] [address]  CBC with Differential     Status: Abnormal   Collection Time: 08/08/2018 10:41 AM  Result Value Ref Range   WBC 20.1 (H) 4.0 - 10.5 K/uL   RBC 2.91 (L) 4.22 - 5.81 MIL/uL   Hemoglobin 10.0 (L) 13.0 - 17.0 g/dL   HCT 13.228.8 (L) 44.039.0 - 10.252.0 %   MCV 99.0 80.0 - 100.0 fL   MCH 34.4 (H) 26.0 - 34.0 pg   MCHC 34.7 30.0 - 36.0 g/dL   RDW 72.515.5 36.611.5 - 44.015.5 %   Platelets 85 (L) 150 - 400 K/uL    Comment: PLATELET COUNT CONFIRMED BY SMEAR SPECIMEN CHECKED FOR CLOTS Immature Platelet Fraction may be  clinically indicated, consider ordering this additional test [id number]    nRBC 0.0 0.0 - 0.2 %   Neutrophils Relative % 83 %   Neutro Abs 16.6 (H) 1.7 - 7.7 K/uL   Lymphocytes Relative 4 %   Lymphs Abs 0.8 0.7 - 4.0 K/uL   Monocytes Relative 11 %   Monocytes Absolute 2.2 (H) 0.1 - 1.0 K/uL   Eosinophils Relative 0 %   Eosinophils Absolute 0.0 0.0 - 0.5 K/uL   Basophils Relative 0 %   Basophils Absolute 0.1 0.0 - 0.1 K/uL   Immature Granulocytes 2 %   Abs Immature Granulocytes 0.46 (H) 0.00 - 0.07 K/uL    Comment: Performed at [facility], [address] [place], [place] [address]  Comprehensive metabolic panel     Status: Abnormal   Collection Time: 08/05/2018 10:41 AM  Result Value Ref Range   Sodium 122 (L) 135 - 145 mmol/L   Potassium 3.9 3.5 - 5.1 mmol/L    Comment: DELTA  CHECK NOTED   Chloride 84 (L) 98 - 111 mmol/L   CO2 13 (L) 22 - 32 mmol/L   Glucose, Bld 122 (H) 70 - 99 mg/dL   BUN 65 (H) 6 - 20 mg/dL   Creatinine, Ser 9.32 (H) 0.61 - 1.24 mg/dL   Calcium 8.4 (L) 8.9 - 10.3 mg/dL   Total Protein 6.7 6.5 - 8.1 g/dL   Albumin 2.2 (L) 3.5 - 5.0 g/dL   AST 98 (H) 15 - 41 U/L   ALT 54 (H) 0 - 44 U/L    Comment: RESULTS CONFIRMED BY MANUAL DILUTION   Alkaline Phosphatase 141 (H) 38 - 126 U/L   Total Bilirubin 40.7 (HH) 0.3 - 1.2 mg/dL    Comment: RESULTS CONFIRMED BY MANUAL DILUTION CRITICAL RESULT CALLED TO, READ BACK BY AND VERIFIED WITH: MINTER,R AT 11:20AM ON 08/05/2018 BY FESTERMAN,C    GFR calc non Af Amer 10 (L) >60 mL/min   GFR calc Af Amer 12 (L) >60 mL/min    Comment: Performed at [facility], [address], [place], [place] [address]  Lipase, blood     Status: Abnormal   Collection Time: 07/14/2018 10:41 AM  Result Value Ref Range   Lipase 86 (H) 11 - 51 U/L    Comment: Performed at [facility], [address] [place], [place] [address]  Protime-INR     Status: Abnormal   Collection Time: 07/22/2018 10:41 AM  Result Value Ref Range   Prothrombin Time 31.3 (H) 11.4 - 15.2 seconds   INR 3.08     Comment: Performed at [facility], [address], [place], [place] [address]  Acetaminophen level     Status: Abnormal   Collection Time: 07/14/2018 10:41 AM  Result Value Ref Range   Acetaminophen (Tylenol), Serum <10 (L) 10 - 30 ug/mL    Comment: (NOTE) Therapeutic concentrations vary significantly. A range of 10-30 ug/mL  may be an effective concentration for many patients. However, some  are best treated at concentrations outside of this range. Acetaminophen concentrations >150 ug/mL at 4 hours after ingestion  and >50 ug/mL at 12 hours after ingestion are often associated with  toxic reactions. Performed at [facility], [address] [place], [place] [address]   Blood culture (routine x 2)     Status: None (Preliminary result)    Collection Time: 08/10/2018 10:41 AM  Result Value Ref Range   Specimen Description BLOOD RIGHT FOREARM    Special Requests  BOTTLES DRAWN AEROBIC AND ANAEROBIC Blood Culture adequate volume Performed at [facility], [address] [place], [place] [address]    Culture PENDING    Report Status PENDING   Lactic acid, plasma     Status: Abnormal   Collection Time: August 28, 2018 10:43 AM  Result Value Ref Range   Lactic Acid, Venous 10.7 (HH) 0.5 - 1.9 mmol/L    Comment: CRITICAL RESULT CALLED TO, READ BACK BY AND VERIFIED WITH: MINTER,R AT 11:10AM ON 08-28-2018 BY [facility] Performed at [facility], [address] [place], [place] [address]   POC CBG, ED     Status: Abnormal   Collection Time: 08/28/2018 10:44 AM  Result Value Ref Range   Glucose-Capillary 114 (H) 70 - 99 mg/dL  Blood culture (routine x 2)     Status: None (Preliminary result)   Collection Time: August 28, 2018 11:15 AM  Result Value Ref Range   Specimen Description BLOOD RIGHT ARM    Special Requests      BOTTLES DRAWN AEROBIC AND ANAEROBIC Blood Culture adequate volume Performed at [facility], [address] [place], [place] [address]    Culture PENDING    Report Status PENDING   Glucose, capillary     Status: None   Collection Time: Aug 28, 2018  3:09 PM  Result Value Ref Range   Glucose-Capillary 83 70 - 99 mg/dL  Creatinine, urine, random     Status: None   Collection Time: 08/28/18  5:00 PM  Result Value Ref Range   Creatinine, Urine 272.27 mg/dL    Comment: Performed at [facility], [address], [place], [place] [address]  Sodium, urine, random     Status: None   Collection Time: 08/28/18  5:00 PM  Result Value Ref Range   Sodium, Ur <10 mmol/L    Comment: Performed at [facility], [address], [place], [place] [address]  Protein / creatinine ratio, urine     Status: Abnormal   Collection Time: 28-Aug-2018  5:00 PM  Result Value Ref Range   Creatinine, Urine 270.56 mg/dL   Total Protein,  Urine 110 mg/dL    Comment: NO NORMAL RANGE ESTABLISHED FOR THIS TEST   Protein Creatinine Ratio 0.41 (H) 0.00 - 0.15 mg/mg[Cre]    Comment: Performed at [facility], [address] [place], [place] [address]  I-STAT 7, (LYTES, BLD GAS, ICA, H+H)     Status: Abnormal   Collection Time: 08-28-2018  5:49 PM  Result Value Ref Range   pH, Arterial 7.510 (H) 7.350 - 7.450   pCO2 arterial 24.9 (L) 32.0 - 48.0 mmHg   pO2, Arterial 364.0 (H) 83.0 - 108.0 mmHg   Bicarbonate 20.0 20.0 - 28.0 mmol/L   TCO2 21 (L) 22 - 32 mmol/L   O2 Saturation 100.0 %   Acid-base deficit 2.0 0.0 - 2.0 mmol/L   Sodium 124 (L) 135 - 145 mmol/L   Potassium 4.7 3.5 - 5.1 mmol/L   Calcium, Ion 0.92 (L) 1.15 - 1.40 mmol/L   HCT 30.0 (L) 39.0 - 52.0 %   Hemoglobin 10.2 (L) 13.0 - 17.0 g/dL   Patient temperature 24.0 F    Collection site RADIAL, ALLEN'S TEST ACCEPTABLE    Drawn by RT    Sample type ARTERIAL    Dg Chest Port 1 View  Result Date: 2018-08-28 CLINICAL DATA:  Evaluate central line placement EXAM: PORTABLE CHEST 1 VIEW COMPARISON:  August 28, 2018 FINDINGS: The left central line now terminates  in the central SVC. The ETT is in good position. The NG tube terminates below today's film. No pneumothorax. No change in the cardiomediastinal silhouette. No other interval changes. IMPRESSION: The left central line is now in good position terminating in the SVC. Other support apparatus as above. No pneumothorax. Electronically Signed   By: Gerome Samavid  Williams III M.D   On: 08/09/2018 17:18   Dg Chest Port 1 View  Result Date: 08/05/2018 CLINICAL DATA:  53 year old male status post intubation and central line placement EXAM: PORTABLE CHEST 1 VIEW COMPARISON:  None. FINDINGS: The patient is intubated. The tip of the endotracheal tube is well positioned 3.3 cm above the carina. A gastric tube is present, the tip of the tube lies inferior to the diaphragm, off the field of view but presumably in the stomach. A left IJ  approach central venous catheter has been placed. The tip of the catheter deflects laterally rather than medially and overlies the scapula, presumably in the axillary vein. No evidence of pneumothorax. Low inspiratory volumes. Minimal bronchitic changes, likely chronic. IMPRESSION: 1. The left IJ approach central venous catheter deflects laterally and overlies the axillary vein. 2. Well-positioned endotracheal tube with the tip 3.3 cm above the carina. 3. The tip of the gastric tube is inferior to the diaphragm, off the field of view but presumably in the stomach. 4. No evidence of pneumothorax, pleural effusion or other acute cardiopulmonary process. 5. Inspiratory volumes are low. These results were called by telephone at the time of interpretation on 08/04/2018 at 4:47 pm to nurse Herbert SetaHeather , who verbally acknowledged these results. Electronically Signed   By: Malachy MoanHeath  McCullough M.D.   On: 08/11/2018 16:47   Dg Abd Portable 1v  Result Date: 08/09/2018 CLINICAL DATA:  Evaluate OG tube placement EXAM: PORTABLE ABDOMEN - 1 VIEW COMPARISON:  None. FINDINGS: The OG tube distal tip terminates to the right of this film. The tip is likely in the distal stomach or proximal duodenum. IMPRESSION: The distal tip of the OG tube extends beyond this study as above. The distal tip is thought to be in the distal stomach or proximal duodenum. Electronically Signed   By: Gerome Samavid  Williams III M.D   On: 07/18/2018 17:19   Review of Systems  Unable to perform ROS: Intubated  Genitourinary: Negative.    Blood pressure (!) 97/58, pulse 94, temperature 97.9 F (36.6 C), temperature source Axillary, resp. rate (!) 25, height 6\' 4"  (1.93 m), weight 104.3 kg, SpO2 99 %. Physical Exam  Constitutional: He appears well-developed and well-nourished. He appears toxic. He is intubated.  Severely jaundiced  HENT:  Head: Normocephalic and atraumatic.  Eyes: EOM are normal.  Cardiovascular: Normal rate and regular rhythm.  Respiratory:  He is intubated.  GI: Soft. He exhibits distension. Bowel sounds are absent.   Assessment/Plan: 1) Acute liver injury with severe jaundice-TB 40.7 with elevated LFT's with altered mental status with acute metabolic encephalopathy complicating chronic alcoholism. Patient is not a candidate for steroids and has mattery discriminant function is calculated at 129.5.  2) Acute kidney injury rule out hepatorenal syndrome. Renal ultrasound and urine electrolytes pending.  3) Anion gap embolic acidosis/lactic acidosis-sepsis workup in progress broad-spectrum antibiotic coverage provided. 4) Acute respiratory failure requiring intubation-?possibility of aspiration. 5) Hyponatremia. Sula Fetterly 07/15/2018, 7:10 PM

## 2018-08-02 NOTE — Progress Notes (Deleted)
eLink Physician-Brief Progress Note Patient Name: Jacob Mullen DOB: February 13, 1966 MRN: [id number]   Date of Service  September 01, 2018  HPI/Events of Note    eICU Interventions          Migdalia Dk 09/01/2018, 11:34 PM

## 2018-08-02 NOTE — Consult Note (Signed)
Reason for Consult: Acute kidney injury, anion gap metabolic acidosis, hyponatremia Referring Physician: Koren Bound MD (CCM)  HPI: (Obtained from chart, patient unresponsive) 53 year old Caucasian man with past medical history significant for chronic alcohol use, hypertension and anxiety/depression with recent labs showing elevated LFTs/bilirubin and ultrasound concerning for chronic hepatocellular disease/cirrhosis with distended gallbladder (presented on 1/8 with jaundice, darkening urine and worsening appetite).  Brought to the [facility] emergency room today after found to be having altered mental status by his mother earlier today.  In spite of previous recommendations, he has continued to consume alcohol.  His creatinine was noted to be 5.6 up from 0.72 weeks ago with hyponatremia and anion gap metabolic acidosis.  He also has an AST of 98, ALT 54, albumin 2.2, ammonia 324, total bilirubin 40.7, direct bilirubin 16.2 and lactic acid 10.7.  No urine output has been reported.  Past Medical History:  Diagnosis Date  . Hypertension   . Thyroid disease     No past surgical history on file.  No family history on file.  Social History:  reports that he has been smoking. He has been smoking about 0.50 packs per day. He does not have any smokeless tobacco history on file. He reports current alcohol use. No history on file for drug.  Allergies:  Allergies  Allergen Reactions  . Codeine Itching    Medications:  Scheduled: . sodium chloride   Intravenous Once  . folic acid  1 mg Intravenous Daily  . lactulose  30 g Per Tube TID  . pantoprazole (PROTONIX) IV  40 mg Intravenous QHS  . rifaximin  550 mg Per Tube BID  . thiamine  100 mg Intravenous Daily   BMP Latest Ref Rng & Units 04-Aug-2018 August 04, 2018 07/19/2018  Glucose 70 - 99 mg/dL 161(W) 960(A) 540(J)  BUN 6 - 20 mg/dL 81(X) 91(Y) 5(L)  Creatinine 0.61 - 1.24 mg/dL 7.82(N) 5.62(Z) 3.08  Sodium 135 - 145 mmol/L 122(L) 122(L) 132(L)   Potassium 3.5 - 5.1 mmol/L 3.9 5.5(H) 3.5  Chloride 98 - 111 mmol/L 84(L) 86(L) 99  CO2 22 - 32 mmol/L 13(L) 11(L) 24  Calcium 8.9 - 10.3 mg/dL 6.5(H) 8.4(O) 9.1   CBC Latest Ref Rng & Units Aug 04, 2018 07/19/2018  WBC 4.0 - 10.5 K/uL 20.1(H) 4.9  Hemoglobin 13.0 - 17.0 g/dL 10.0(L) 12.5(L)  Hematocrit 39.0 - 52.0 % 28.8(L) 36.1(L)  Platelets 150 - 400 K/uL 85(L) 46(L)   Urinalysis    Component Value Date/Time   COLORURINE AMBER (A) 07/19/2018 2054   APPEARANCEUR CLOUDY (A) 07/19/2018 2054   LABSPEC 1.017 07/19/2018 2054   PHURINE 6.0 07/19/2018 2054   GLUCOSEU 50 (A) 07/19/2018 2054   HGBUR SMALL (A) 07/19/2018 2054   BILIRUBINUR MODERATE (A) 07/19/2018 2054   KETONESUR NEGATIVE 07/19/2018 2054   PROTEINUR 30 (A) 07/19/2018 2054   NITRITE NEGATIVE 07/19/2018 2054   LEUKOCYTESUR NEGATIVE 07/19/2018 2054    Review of Systems  Unable to perform ROS: Intubated   Blood pressure (!) 97/58, pulse (!) 107, temperature 97.9 F (36.6 C), temperature source Axillary, resp. rate (!) 32, height 6\' 4"  (1.93 m), weight 104.3 kg, SpO2 100 %. Physical Exam  Nursing note and vitals reviewed. Constitutional: He appears well-developed and well-nourished.  Global icterus  HENT:  Head: Normocephalic and atraumatic.  Intubated, sedated  Eyes: Pupils are equal, round, and reactive to light. Scleral icterus is present.  Neck: Normal range of motion. No JVD present. No thyromegaly present.  Left IJ CVC  Cardiovascular:  Normal rate, regular rhythm and normal heart sounds.  No murmur heard. Respiratory: Effort normal and breath sounds normal. He has no wheezes.  GI: Bowel sounds are normal. He exhibits distension. There is no abdominal tenderness. There is no rebound.  Genitourinary:    Genitourinary Comments: [employer] colored scant urine in urine bag   Musculoskeletal: Normal range of motion.        General: No edema.  Neurological:  Unresponsive  Skin: Skin is warm and dry.  Icterus     Assessment/Plan: 1.  Acute kidney injury: The history and sequence of events pointing largely to hemodynamically mediated acute kidney injury possibly prerenal but cannot entirely rule out hepatorenal syndrome without additional data.  Await renal ultrasound, repeat urinalysis and urine electrolytes prior to formulating a management plan.  He does have profound metabolic acidosis and is reported as being anuric and as we await correction of his coagulopathy before a temporary dialysis catheter can be placed. I will begin him on isotonic sodium bicarbonate for management of anion gap metabolic acidosis and if this remains refractory or worsens, will consider CRRT especially if he remains anuric.  Overall poor prognosis. 2.  Anion gap metabolic acidosis: Secondary to acute kidney injury as well as lactic acidosis from circulatory shock.  Ongoing evaluation for sepsis.  On broad-spectrum antimicrobial coverage. 3.  Acute metabolic encephalopathy: Suspected multiple etiologies including hepatic encephalopathy and uremia. 4.  Hyponatremia: Secondary to acute kidney injury as well as underlying cirrhosis/chronic alcohol use.  Will need to exercise caution with intravenous fluids so as not to raise sodium level too abruptly.  Will check urine osmolality and serum osmolality. 5.  Acute respiratory failure: Secondary to altered mental status/inability to protect airway.  Status post intubation. 6.  Acute hepatic injury on underlying cirrhosis: Secondary to alcohol-induced injury and ongoing supportive management.  Saima Monterroso K. 2018-08-21, 4:25 PM

## 2018-08-02 NOTE — ED Notes (Signed)
Report given to Herbert Seta, RN at [facility].

## 2018-08-02 NOTE — Progress Notes (Signed)
Pharmacy Antibiotic Note  Jacob Mullen is a 53 y.o. male admitted on 07/13/2018 with pneumonia and peritonitis.  Pharmacy has been consulted for zosyn dosing.  Pt with a hx of chonic ETOH who was admitted with AMS and jaundice. He has severely elevated bilirubin. Cultures have been sent. He got one dose of vanc and cefepime at [facility] before being tx here. Zosyn will be used empirically. He is also in acute renal failure.   Plan:  Zosyn 3.375g IV x1 then 2.25g IV q8   Height: 6\' 4"  (193 cm) Weight: 230 lb (104.3 kg) IBW/kg (Calculated) : 86.8  Temp (24hrs), Avg:96.7 F (35.9 C), Min:95.5 F (35.3 C), Max:97.9 F (36.6 C)  Recent Labs  Lab 07/18/2018 1010 07/30/2018 1041 08/01/2018 1043  WBC  --  20.1*  --   CREATININE 4.91* 5.64*  --   LATICACIDVEN  --   --  10.7*    Estimated Creatinine Clearance: 19.9 mL/min (A) (by C-G formula based on SCr of 5.64 mg/dL (H)).    Allergies  Allergen Reactions  . Codeine Itching    Antimicrobials this admission: 1/22 vanc x1 1/22 cefepime x1 1/22 zosyn>>  Dose adjustments this admission:   Microbiology results: 1/22 blood>> 1/22 peritoneal>> 1/22 resp>>  Ulyses Southward, PharmD, [place], AAHIVP, CPP Infectious Disease Pharmacist 07/27/2018 4:22 PM

## 2018-08-02 NOTE — Procedures (Signed)
Paracentesis Procedure Note  Procedure done without consent due to acute liver failure.  Patient positioned.  Pocket found with U/S.  Area cleaned and prepped.  21 gauge needle 1.5 inch needle used and 20 ml of straw color fluid removed.  Sent for fluid analysis and cultures.  Alyson Reedy, M.D. [facility] Pulmonary/Critical Care Medicine. Pager: [phone]. After hours pager: [phone]

## 2018-08-02 NOTE — Progress Notes (Signed)
Pharmacy Note:  Initial antibiotic(s) regimen of Vancomycin and cefepime ordered by EDP to treat unknown source of infection.  CrCl cannot be calculated (Unknown ideal weight.).   Allergies  Allergen Reactions  . Codeine Itching    Vitals:   2019/05/11 1400 2019/05/11 1415  BP: (!) 144/83 129/64  Pulse: (!) 106 (!) 107  Resp: (!) 27 (!) 29  Temp: 97.9 F (36.6 C)   SpO2: 95% 98%    Anti-infectives (From admission, onward)   Start     Dose/Rate Route Frequency Ordered Stop   2019/05/11 1130  vancomycin (VANCOCIN) 2,000 mg in sodium chloride 0.9 % 500 mL IVPB     2,000 mg 250 mL/hr over 120 Minutes Intravenous  Once 2019/05/11 1124     2019/05/11 1115  ceFEPIme (MAXIPIME) 2 g in sodium chloride 0.9 % 100 mL IVPB     2 g 200 mL/hr over 30 Minutes Intravenous  Once 2019/05/11 1114 2019/05/11 1212   2019/05/11 1115  metroNIDAZOLE (FLAGYL) IVPB 500 mg     500 mg 100 mL/hr over 60 Minutes Intravenous Every 8 hours 2019/05/11 1114     2019/05/11 1115  vancomycin (VANCOCIN) IVPB 1000 mg/200 mL premix  Status:  Discontinued     1,000 mg 200 mL/hr over 60 Minutes Intravenous  Once 2019/05/11 1114 2019/05/11 1124      Plan: Initial dose(s) of Vancomycin 2gm IV and Cefepime 2gm IV X 1 ordered. F/U admission orders for further dosing if therapy continued.  Elder CyphersLorie Damonie Ellenwood, BS Pharm D, [place] Clinical Pharmacist Pager [phone] 01-16-19 2:27 PM

## 2018-08-02 NOTE — Consult Note (Addendum)
UNASSIGNED PATIENT Reason for Consult: Jaundice with encephaopathy. Referring Physician: CCM.  Jacob ArnoldMark S Mullen is an 53 y.o. male.  HPI: Jacob Mullen is a 53 year old white male who presented to the emergency room initially on 07/19/2018 with complaints of jaundice, worsening of his anorexia and progressive weakness. She had been drinking 6 of the 12 ounce beers per day and was found to have elevated LFTs and abdominal ultrasound was negative for obstruction was advised to see a gastroenterologist an outpatient basis. He presented the and [facility] emergency room today with altered mental status and severe jaundice and was brought to the emergency room for further evaluation and treatment after being transferred from [facility]. And reviewing his records his bilirubin had gone up 240.7 with alkaline phosphatase of 141 and a sodium of 122 with creatinine of 5.6 and albumin is 65. He was admitted to the ICU because of his profound metabolic derangements. He vomited a little bit after admission and was intubated to protect his airway and was started on broad-spectrum antibiotics. GI consultation was procured to see the patient would benefit from steroids for alcoholic hepatitis.   Past Medical History:  Diagnosis Date  . Hypertension   . Thyroid disease    No past surgical history on file.  No family history on file.  Social History:  reports that he has been smoking. He has been smoking about 0.50 packs per day. He does not have any smokeless tobacco history on file. He reports current alcohol use. No history on file for drug.  Allergies:  Allergies  Allergen Reactions  . Codeine Itching   Medications: I have reviewed the patient's current medications.  Results for orders placed or performed during the hospital encounter of 07/23/2018 (from the past 48 hour(s))  Basic metabolic panel     Status: Abnormal   Collection Time: 07/25/2018 10:10 AM  Result Value Ref Range   Sodium 122 (L) 135  - 145 mmol/L   Potassium 5.5 (H) 3.5 - 5.1 mmol/L   Chloride 86 (L) 98 - 111 mmol/L   CO2 11 (L) 22 - 32 mmol/L   Glucose, Bld 120 (H) 70 - 99 mg/dL   BUN 64 (H) 6 - 20 mg/dL   Creatinine, Ser 5.784.91 (H) 0.61 - 1.24 mg/dL   Calcium 8.4 (L) 8.9 - 10.3 mg/dL   GFR calc non Af Amer 12 (L) >60 mL/min   GFR calc Af Amer 14 (L) >60 mL/min    Comment: Performed at [facility], [address] [place], [place] [address]  Ammonia     Status: Abnormal   Collection Time: 07/30/2018 10:10 AM  Result Value Ref Range   Ammonia 324 (H) 9 - 35 umol/L    Comment: Performed at [facility], [address] [place], [place] [address]  CBC with Differential     Status: Abnormal   Collection Time: 08/08/2018 10:41 AM  Result Value Ref Range   WBC 20.1 (H) 4.0 - 10.5 K/uL   RBC 2.91 (L) 4.22 - 5.81 MIL/uL   Hemoglobin 10.0 (L) 13.0 - 17.0 g/dL   HCT 13.228.8 (L) 44.039.0 - 10.252.0 %   MCV 99.0 80.0 - 100.0 fL   MCH 34.4 (H) 26.0 - 34.0 pg   MCHC 34.7 30.0 - 36.0 g/dL   RDW 72.515.5 36.611.5 - 44.015.5 %   Platelets 85 (L) 150 - 400 K/uL    Comment: PLATELET COUNT CONFIRMED BY SMEAR SPECIMEN CHECKED FOR CLOTS Immature Platelet Fraction may be  clinically indicated, consider ordering this additional test [id number]    nRBC 0.0 0.0 - 0.2 %   Neutrophils Relative % 83 %   Neutro Abs 16.6 (H) 1.7 - 7.7 K/uL   Lymphocytes Relative 4 %   Lymphs Abs 0.8 0.7 - 4.0 K/uL   Monocytes Relative 11 %   Monocytes Absolute 2.2 (H) 0.1 - 1.0 K/uL   Eosinophils Relative 0 %   Eosinophils Absolute 0.0 0.0 - 0.5 K/uL   Basophils Relative 0 %   Basophils Absolute 0.1 0.0 - 0.1 K/uL   Immature Granulocytes 2 %   Abs Immature Granulocytes 0.46 (H) 0.00 - 0.07 K/uL    Comment: Performed at [facility], [address] [place], [place] [address]  Comprehensive metabolic panel     Status: Abnormal   Collection Time: 08/05/2018 10:41 AM  Result Value Ref Range   Sodium 122 (L) 135 - 145 mmol/L   Potassium 3.9 3.5 - 5.1 mmol/L    Comment: DELTA  CHECK NOTED   Chloride 84 (L) 98 - 111 mmol/L   CO2 13 (L) 22 - 32 mmol/L   Glucose, Bld 122 (H) 70 - 99 mg/dL   BUN 65 (H) 6 - 20 mg/dL   Creatinine, Ser 9.32 (H) 0.61 - 1.24 mg/dL   Calcium 8.4 (L) 8.9 - 10.3 mg/dL   Total Protein 6.7 6.5 - 8.1 g/dL   Albumin 2.2 (L) 3.5 - 5.0 g/dL   AST 98 (H) 15 - 41 U/L   ALT 54 (H) 0 - 44 U/L    Comment: RESULTS CONFIRMED BY MANUAL DILUTION   Alkaline Phosphatase 141 (H) 38 - 126 U/L   Total Bilirubin 40.7 (HH) 0.3 - 1.2 mg/dL    Comment: RESULTS CONFIRMED BY MANUAL DILUTION CRITICAL RESULT CALLED TO, READ BACK BY AND VERIFIED WITH: MINTER,R AT 11:20AM ON 08/05/2018 BY FESTERMAN,C    GFR calc non Af Amer 10 (L) >60 mL/min   GFR calc Af Amer 12 (L) >60 mL/min    Comment: Performed at [facility], [address], [place], [place] [address]  Lipase, blood     Status: Abnormal   Collection Time: 07/14/2018 10:41 AM  Result Value Ref Range   Lipase 86 (H) 11 - 51 U/L    Comment: Performed at [facility], [address] [place], [place] [address]  Protime-INR     Status: Abnormal   Collection Time: 07/22/2018 10:41 AM  Result Value Ref Range   Prothrombin Time 31.3 (H) 11.4 - 15.2 seconds   INR 3.08     Comment: Performed at [facility], [address], [place], [place] [address]  Acetaminophen level     Status: Abnormal   Collection Time: 07/14/2018 10:41 AM  Result Value Ref Range   Acetaminophen (Tylenol), Serum <10 (L) 10 - 30 ug/mL    Comment: (NOTE) Therapeutic concentrations vary significantly. A range of 10-30 ug/mL  may be an effective concentration for many patients. However, some  are best treated at concentrations outside of this range. Acetaminophen concentrations >150 ug/mL at 4 hours after ingestion  and >50 ug/mL at 12 hours after ingestion are often associated with  toxic reactions. Performed at [facility], [address] [place], [place] [address]   Blood culture (routine x 2)     Status: None (Preliminary result)    Collection Time: 08/10/2018 10:41 AM  Result Value Ref Range   Specimen Description BLOOD RIGHT FOREARM    Special Requests  BOTTLES DRAWN AEROBIC AND ANAEROBIC Blood Culture adequate volume Performed at [facility], [address] [place], [place] [address]    Culture PENDING    Report Status PENDING   Lactic acid, plasma     Status: Abnormal   Collection Time: August 28, 2018 10:43 AM  Result Value Ref Range   Lactic Acid, Venous 10.7 (HH) 0.5 - 1.9 mmol/L    Comment: CRITICAL RESULT CALLED TO, READ BACK BY AND VERIFIED WITH: MINTER,R AT 11:10AM ON 08-28-2018 BY [facility] Performed at [facility], [address] [place], [place] [address]   POC CBG, ED     Status: Abnormal   Collection Time: 08/28/2018 10:44 AM  Result Value Ref Range   Glucose-Capillary 114 (H) 70 - 99 mg/dL  Blood culture (routine x 2)     Status: None (Preliminary result)   Collection Time: August 28, 2018 11:15 AM  Result Value Ref Range   Specimen Description BLOOD RIGHT ARM    Special Requests      BOTTLES DRAWN AEROBIC AND ANAEROBIC Blood Culture adequate volume Performed at [facility], [address] [place], [place] [address]    Culture PENDING    Report Status PENDING   Glucose, capillary     Status: None   Collection Time: Aug 28, 2018  3:09 PM  Result Value Ref Range   Glucose-Capillary 83 70 - 99 mg/dL  Creatinine, urine, random     Status: None   Collection Time: 08/28/18  5:00 PM  Result Value Ref Range   Creatinine, Urine 272.27 mg/dL    Comment: Performed at [facility], [address], [place], [place] [address]  Sodium, urine, random     Status: None   Collection Time: 08/28/18  5:00 PM  Result Value Ref Range   Sodium, Ur <10 mmol/L    Comment: Performed at [facility], [address], [place], [place] [address]  Protein / creatinine ratio, urine     Status: Abnormal   Collection Time: 28-Aug-2018  5:00 PM  Result Value Ref Range   Creatinine, Urine 270.56 mg/dL   Total Protein,  Urine 110 mg/dL    Comment: NO NORMAL RANGE ESTABLISHED FOR THIS TEST   Protein Creatinine Ratio 0.41 (H) 0.00 - 0.15 mg/mg[Cre]    Comment: Performed at [facility], [address] [place], [place] [address]  I-STAT 7, (LYTES, BLD GAS, ICA, H+H)     Status: Abnormal   Collection Time: 08-28-2018  5:49 PM  Result Value Ref Range   pH, Arterial 7.510 (H) 7.350 - 7.450   pCO2 arterial 24.9 (L) 32.0 - 48.0 mmHg   pO2, Arterial 364.0 (H) 83.0 - 108.0 mmHg   Bicarbonate 20.0 20.0 - 28.0 mmol/L   TCO2 21 (L) 22 - 32 mmol/L   O2 Saturation 100.0 %   Acid-base deficit 2.0 0.0 - 2.0 mmol/L   Sodium 124 (L) 135 - 145 mmol/L   Potassium 4.7 3.5 - 5.1 mmol/L   Calcium, Ion 0.92 (L) 1.15 - 1.40 mmol/L   HCT 30.0 (L) 39.0 - 52.0 %   Hemoglobin 10.2 (L) 13.0 - 17.0 g/dL   Patient temperature 24.0 F    Collection site RADIAL, ALLEN'S TEST ACCEPTABLE    Drawn by RT    Sample type ARTERIAL    Dg Chest Port 1 View  Result Date: 2018-08-28 CLINICAL DATA:  Evaluate central line placement EXAM: PORTABLE CHEST 1 VIEW COMPARISON:  August 28, 2018 FINDINGS: The left central line now terminates  in the central SVC. The ETT is in good position. The NG tube terminates below today's film. No pneumothorax. No change in the cardiomediastinal silhouette. No other interval changes. IMPRESSION: The left central line is now in good position terminating in the SVC. Other support apparatus as above. No pneumothorax. Electronically Signed   By: Gerome Sam III M.D   On: 07/29/2018 17:18   Dg Chest Port 1 View  Result Date: 08/04/2018 CLINICAL DATA:  53 year old male status post intubation and central line placement EXAM: PORTABLE CHEST 1 VIEW COMPARISON:  None. FINDINGS: The patient is intubated. The tip of the endotracheal tube is well positioned 3.3 cm above the carina. A gastric tube is present, the tip of the tube lies inferior to the diaphragm, off the field of view but presumably in the stomach. A left IJ  approach central venous catheter has been placed. The tip of the catheter deflects laterally rather than medially and overlies the scapula, presumably in the axillary vein. No evidence of pneumothorax. Low inspiratory volumes. Minimal bronchitic changes, likely chronic. IMPRESSION: 1. The left IJ approach central venous catheter deflects laterally and overlies the axillary vein. 2. Well-positioned endotracheal tube with the tip 3.3 cm above the carina. 3. The tip of the gastric tube is inferior to the diaphragm, off the field of view but presumably in the stomach. 4. No evidence of pneumothorax, pleural effusion or other acute cardiopulmonary process. 5. Inspiratory volumes are low. These results were called by telephone at the time of interpretation on 08/11/2018 at 4:47 pm to nurse Herbert Seta , who verbally acknowledged these results. Electronically Signed   By: Malachy Moan M.D.   On: 07/17/2018 16:47   Dg Abd Portable 1v  Result Date: 07/30/2018 CLINICAL DATA:  Evaluate OG tube placement EXAM: PORTABLE ABDOMEN - 1 VIEW COMPARISON:  None. FINDINGS: The OG tube distal tip terminates to the right of this film. The tip is likely in the distal stomach or proximal duodenum. IMPRESSION: The distal tip of the OG tube extends beyond this study as above. The distal tip is thought to be in the distal stomach or proximal duodenum. Electronically Signed   By: Gerome Sam III M.D   On: 08/10/2018 17:19   Review of Systems  Unable to perform ROS: Intubated  Genitourinary: Negative.    Blood pressure (!) 97/58, pulse 94, temperature 97.9 F (36.6 C), temperature source Axillary, resp. rate (!) 25, height 6\' 4"  (1.93 m), weight 104.3 kg, SpO2 99 %. Physical Exam  Constitutional: He appears well-developed and well-nourished. He appears toxic. He is intubated.  Severely jaundiced  HENT:  Head: Normocephalic and atraumatic.  Eyes: EOM are normal.  Cardiovascular: Normal rate and regular rhythm.  Respiratory:  He is intubated.  GI: Soft. He exhibits distension. Bowel sounds are absent.   Assessment/Plan: 1) Acute liver injury with severe jaundice-TB 40.7 with elevated LFT's with altered mental status with acute metabolic encephalopathy complicating chronic alcoholism. Patient is not a candidate for steroids and has mattery discriminant function is calculated at 129.5.  2) Acute kidney injury rule out hepatorenal syndrome. Renal ultrasound and urine electrolytes pending.  3) Anion gap metabolic acidosis/lactic acidosis-sepsis workup in progress broad-spectrum antibiotic coverage provided. 4) Acute respiratory failure requiring intubation-?possibility of aspiration. 5) Hyponatremia. Ayianna Darnold 08/01/2018, 7:10 PM

## 2018-08-02 NOTE — ED Triage Notes (Signed)
Pt was seen at [facility] ED for same a few weeks ago. Pt mom reports pt has not been able to see GI. Pt severe jaundice with confusion and agitation.

## 2018-08-02 NOTE — ED Notes (Signed)
Report given to Dorene Sorrow with [facility]. ETA 20 min

## 2018-08-02 NOTE — Procedures (Signed)
Central Venous Catheter Insertion Procedure Note BRADY MUSSEN [id number] 1966/03/27  Procedure: Insertion of Central Venous Catheter Indications: Assessment of intravascular volume, Drug and/or fluid administration and Frequent blood sampling  Procedure Details Consent: Unable to obtain consent because of emergent medical necessity. Time Out: Verified patient identification, verified procedure, site/side was marked, verified correct patient position, special equipment/implants available, medications/allergies/relevent history reviewed, required imaging and test results available.  Performed  Maximum sterile technique was used including antiseptics, cap, gloves, gown, hand hygiene, mask and sheet. Skin prep: Chlorhexidine; local anesthetic administered  A antimicrobial bonded/coated triple lumen catheter was placed in the left internal jugular vein using the Seldinger technique > central line changed over wire due to malposition. Biopatch applied, sutured in place.    Evaluation Blood flow good Complications: No apparent complications Patient did tolerate procedure well. Chest X-ray ordered to verify placement.  CXR: pending.   Procedure performed under direct supervision of Dr. Molli Knock and with ultrasound guidance for real time vessel cannulation.      Canary Brim, NP-C [facility] Pgr: [phone] or if no answer [phone] 07/20/2018, 5:01 PM

## 2018-08-02 NOTE — ED Notes (Signed)
CRITICAL VALUE ALERT  Critical Value:  Bilirubin 40.7  Date & Time Notied:  No new orders at this time  Provider Notified: Burgess Amor, PA  Orders Received/Actions taken: no new orders at this time

## 2018-08-02 NOTE — ED Provider Notes (Signed)
[facility] EMERGENCY DEPARTMENT Provider Note   CSN: [id number] Arrival date & time: 08/04/2018  0940     History   Chief Complaint Chief Complaint  Patient presents with  . Altered Mental Status    HPI Jacob Mullen is a 53 y.o. male with a history of htn, thyroid disease and etoh abuse, seen at [facility] on 07/19/18 with complaint of jaundice at which time he had an an extremely elevated bilirubin level and US imaging revealed suspected cirrhosis, but also had a distended gallbladder but with a normal caliber CBD, no stones.  Since this visit he has been awaiting GI consult which was to occur today.  Mother at the bedside endorses he has continued to drink etoh and has had worsening jaundice and increasing drowsiness and poor oral intake.  She states he mostly slept yesterday, but today, he was found to be agitated and confused so presented here.  The patient is unable to elaborate re symptoms given confusion.  She endorses there was some old appearing dark vomit in his home when she went to pick him up for his GI appt today.  The history is provided by a parent. The history is limited by the condition of the patient.    Past Medical History:  Diagnosis Date  . Hypertension   . Thyroid disease     Patient Active Problem List   Diagnosis Date Noted  . Hepatic encephalopathy (HCC) 07/25/2018    No past surgical history on file.      Home Medications    Prior to Admission medications   Medication Sig Start Date End Date Taking? Authorizing Provider  mirtazapine (REMERON) 15 MG tablet Take 2 tablets (30 mg total) by mouth at bedtime. 06/14/18   Shugart, Mat Carne, PA-C  traZODone (DESYREL) 50 MG tablet Take 2 tablets (100 mg total) by mouth at bedtime. 05/24/18   Anne Fu, PA-C    Family History No family history on file.  Social History Social History   Tobacco Use  . Smoking status: Current Every Day Smoker    Packs/day: 0.50  Substance Use Topics  . Alcohol use: Yes  .  Drug use: Not on file     Allergies   Codeine   Review of Systems Review of Systems  Unable to perform ROS: Mental status change  Gastrointestinal: Positive for abdominal distention and vomiting.     Physical Exam Updated Vital Signs BP (!) 149/74   Pulse 98   Resp (!) 23   Wt 104.3 kg   SpO2 94%   Physical Exam Vitals signs and nursing note reviewed.  Constitutional:      Appearance: He is well-developed. He is ill-appearing.     Comments: Agitated. Confused.  HENT:     Head: Normocephalic and atraumatic.  Eyes:     General: Scleral icterus present.     Conjunctiva/sclera: Conjunctivae normal.  Neck:     Musculoskeletal: Normal range of motion.  Cardiovascular:     Rate and Rhythm: Normal rate and regular rhythm.     Pulses: Normal pulses.     Heart sounds: Normal heart sounds.  Pulmonary:     Effort: Pulmonary effort is normal.     Breath sounds: Normal breath sounds. No wheezing.  Abdominal:     General: Bowel sounds are normal. There is distension.     Palpations: Abdomen is soft.     Tenderness: There is no guarding.  Skin:    General: Skin is warm and dry.  Coloration: Skin is jaundiced.  Neurological:     Mental Status: He is disoriented.      ED Treatments / Results  Labs (all labs ordered are listed, but only abnormal results are displayed) Labs Reviewed  BASIC METABOLIC PANEL - Abnormal; Notable for the following components:      Result Value   Sodium 122 (*)    Potassium 5.5 (*)    Chloride 86 (*)    CO2 11 (*)    Glucose, Bld 120 (*)    BUN 64 (*)    Creatinine, Ser 4.91 (*)    Calcium 8.4 (*)    GFR calc non Af Amer 12 (*)    GFR calc Af Amer 14 (*)    All other components within normal limits  AMMONIA - Abnormal; Notable for the following components:   Ammonia 324 (*)    All other components within normal limits  CBC WITH DIFFERENTIAL/PLATELET - Abnormal; Notable for the following components:   WBC 20.1 (*)    RBC 2.91 (*)     Hemoglobin 10.0 (*)    HCT 28.8 (*)    MCH 34.4 (*)    Platelets 85 (*)    Neutro Abs 16.6 (*)    Monocytes Absolute 2.2 (*)    Abs Immature Granulocytes 0.46 (*)    All other components within normal limits  COMPREHENSIVE METABOLIC PANEL - Abnormal; Notable for the following components:   Sodium 122 (*)    Chloride 84 (*)    CO2 13 (*)    Glucose, Bld 122 (*)    BUN 65 (*)    Creatinine, Ser 5.64 (*)    Calcium 8.4 (*)    Albumin 2.2 (*)    AST 98 (*)    ALT 54 (*)    Alkaline Phosphatase 141 (*)    Total Bilirubin 40.7 (*)    GFR calc non Af Amer 10 (*)    GFR calc Af Amer 12 (*)    All other components within normal limits  LIPASE, BLOOD - Abnormal; Notable for the following components:   Lipase 86 (*)    All other components within normal limits  LACTIC ACID, PLASMA - Abnormal; Notable for the following components:   Lactic Acid, Venous 10.7 (*)    All other components within normal limits  PROTIME-INR - Abnormal; Notable for the following components:   Prothrombin Time 31.3 (*)    All other components within normal limits  ACETAMINOPHEN LEVEL - Abnormal; Notable for the following components:   Acetaminophen (Tylenol), Serum <10 (*)    All other components within normal limits  CBG MONITORING, ED - Abnormal; Notable for the following components:   Glucose-Capillary 114 (*)    All other components within normal limits  CULTURE, BLOOD (ROUTINE X 2)  CULTURE, BLOOD (ROUTINE X 2)    EKG EKG Interpretation  Date/Time:  Wednesday August 02 2018 10:16:33 EST Ventricular Rate:  83 PR Interval:    QRS Duration: 105 QT Interval:  438 QTC Calculation: 515 R Axis:   29 Text Interpretation:  Sinus rhythm Borderline repolarization abnormality Prolonged QT interval Baseline wander in lead(s) I No STEMI.  Confirmed by Alona BeneLong, Joshua [phone]([id number]) on Jun 07, 2019 10:56:38 AM   Radiology No results found.  Procedures Procedures (including critical care time)  Medications  Ordered in ED Medications  metroNIDAZOLE (FLAGYL) IVPB 500 mg (500 mg Intravenous New Bag/Given 01/01/2019 1202)  vancomycin (VANCOCIN) 2,000 mg in sodium chloride 0.9 % 500 mL IVPB (  2,000 mg Intravenous New Bag/Given 08/05/2018 1242)  sodium chloride 0.9 % bolus 500 mL (0 mLs Intravenous Stopped 08/07/2018 1145)  ceFEPIme (MAXIPIME) 2 g in sodium chloride 0.9 % 100 mL IVPB (0 g Intravenous Stopped 08/01/2018 1212)  LORazepam (ATIVAN) injection 0.5 mg (0.5 mg Intravenous Given 08/05/2018 1140)     Initial Impression / Assessment and Plan / ED Course  I have reviewed the triage vital signs and the nursing notes.  Pertinent labs & imaging results that were available during my care of the patient were reviewed by me and considered in my medical decision making (see chart for details).     11:20 AM spoke with Dr. Karilyn Cota regarding this patient.  He reviewed the labs and we discussed his past and recent history.  He recommended critical care at [facility] for supportive care.  He would not benefit by transfer to a tertiary care center as he has not been abstinent from EtOH x1 year, since he would not be considered eligible for any transplant protocol.  Call placed to critical care for admission. Discussed case with Dr Molli Knock with critical care. Accepts pt in transfer after confirming pt full code.  Discussed with mother at bedside and he does not have advanced directives and would want full code status.   Also call placed to nephrology given new renal failure.   CRITICAL CARE Performed by: Burgess Amor Total critical care time: 45 minutes Critical care time was exclusive of separately billable procedures and treating other patients. Critical care was necessary to treat or prevent imminent or life-threatening deterioration. Critical care was time spent personally by me on the following activities: development of treatment plan with patient and/or surrogate as well as nursing, discussions with consultants, evaluation of  patient's response to treatment, examination of patient, obtaining history from patient or surrogate, ordering and performing treatments and interventions, ordering and review of laboratory studies, ordering and review of radiographic studies, pulse oximetry and re-evaluation of patient's condition.   Final Clinical Impressions(s) / ED Diagnoses   Final diagnoses:  Hepatorenal failure (HCC)  Altered mental status, unspecified altered mental status type  Hyponatremia  Hyperbilirubinemia    ED Discharge Orders    None       Victoriano Lain 07/14/2018 1254    Long, Arlyss Repress, MD 07/30/2018 1857    Maia Plan, MD 07/14/2018 [phone]

## 2018-08-02 NOTE — Procedures (Signed)
Intubation Procedure Note Jacob Mullen [id number] 1966-04-24  Procedure: Intubation Indications: Respiratory insufficiency  Procedure Details Consent: Unable to obtain consent because of emergent medical necessity. Time Out: Verified patient identification, verified procedure, site/side was marked, verified correct patient position, special equipment/implants available, medications/allergies/relevent history reviewed, required imaging and test results available.  Performed  Maximum sterile technique was used including gloves, hand hygiene and mask.  MAC    Evaluation Hemodynamic Status: BP stable throughout; O2 sats: stable throughout Patient's Current Condition: stable Complications: No apparent complications Patient did tolerate procedure well. Chest X-ray ordered to verify placement.  CXR: pending.   Jacob Mullen 07/26/2018

## 2018-08-02 NOTE — ED Notes (Signed)
Attempted to give report to [facility]10[facility], nurse able to take call at this time. Will attempt again. [facility] loaded pt on stretcher at this time.

## 2018-08-02 NOTE — Procedures (Signed)
OGT Placement  OGT placed under direct laryngoscopy and confirmed by auscultation  Wesam G. Yacoub, M.D. [place] [facility]. Pager: [phone]. After hours pager: [phone]. 

## 2018-08-02 NOTE — H&P (Addendum)
NAME:  Jacob Mullen, MRN:  [id number], DOB:  1966-03-05, LOS: 0 ADMISSION DATE:  07/18/2018, CONSULTATION DATE:  1/22 REFERRING MD:  Alfonzo Feller, CHIEF COMPLAINT:  Acute metabolic encephaloapthy    Brief History   54 yom w/ sig h/o ETOH, recently identified elevated LFTs and bilirubin w/ [place] raising concern for chronic hepatocellular disease & cirrhosis as well as distended GB w/out gallstones, biliary dilation or wall thickening. Admitted acutely on 1/22 after being found by mother encephalopathic. Lab findings showing: ammonia 324, INR 3.08, new renal failure w/ cr 5.64, rising t-bilirubin from 26.8 to 40.7 and increased lipase. PCCM asked to admit given multiple metabolic derangements and hepato-renal failure   History of present illness    53 year old male patient with known history of  Presented to [facility] initially on 1/8 with chief complaint of jaundice, marked by yellowing of the eyes, darkening urine and worsening appetite for about a 2-week timeframe.  He has had some progressive weakness.  Admitted to drinking 4 to 512 ounce beers daily, and has reported withdrawal type symptoms and has been admitted for withdrawal in the past.  In the ER he was found to have elevated bilirubin and mildly elevated LFTs and ultrasound of the abdomen was checked, it was negative for obstruction he was instructed to abstain from alcohol, and obtain GI outpatient consultation.  He presents to the emergency room on 1/22 at [facility] with chief complaint of altered mental status.  He apparently continues to drink.  He was apparently found by his mother the a.m. of 1/22 agitated and confused, she did find some dark appearing emesis in the house when she went to pick him up.  Diagnostic evaluation in the emergency room demonstrated the following: Lipase elevated from 45 up to 86, ammonia level 324, AST 98, ALT 54, total bilirubin 40.7 with alk phos of 141, sodium of 122, BUN and creatinine acutely elevated up to 5.64 and  65.  Because of his profound metabolic derangements and acute hepatic and metabolic encephalopathy he was transferred to [facility] for further evaluation and sub-specialty care.  Past Medical History  ETOH   Significant Hospital Events   1/22: Admitted with acute encephalopathy felt metabolic and hepatic in nature, acute renal failure, profound lactic acidosis, ineffective airway management, and profound jaundice.  Consults:  GI medicine 1/22  Procedures:  Oral endotracheal tube 1/22   Significant Diagnostic Tests:  Abdominal ultrasound 1/8: Heterogenous hepatic and echogenicity suggesting steatosis versus chronic hepatocellular disease.  Mild micronodular contours of the liver suggesting cirrhosis.  There is trace perihepatic ascites.  The gallbladder was distended, irregular in shape but there were no gallstones or wall thickening there was no biliary dilation 1/22: Lipase 86 up from 45 2 weeks prior, ammonia 324, Acetaminophen less than 10, lactic acid 10.7, INR 3.08, alk phos 141 down from 162, total bilirubin 40.7 up from 26.8, serum creatinine 5.64 CT abdomen pelvis 1/22 Micro Data:  Blood cultures times two 1/22 Respiratory culture 1/22>>> Ascites 1/22>>>  Antimicrobials:  Zosyn 1/22  Interim history/subjective:  Obtunded, moaning, frothing from mouth.  Encephalopathic.  Objective   Blood pressure 129/64, pulse (Abnormal) 107, temperature 97.9 F (36.6 C), temperature source Axillary, resp. rate (Abnormal) 29, weight 104.3 kg, SpO2 98 %.        Intake/Output Summary (Last 24 hours) at 08/04/2018 1451 Last data filed at 07/19/2018 1302 Gross per 24 hour  Intake 700 ml  Output no documentation  Net 700 ml   [employer]  07/21/2018 1002  Weight: 104.3 kg    Examination: General: Jaundiced appearing white male sclera completely icteric he is encephalopathic, nonverbal, moans only, is in acute distress HENT: Normocephalic atraumatic sclera are icteric mucous membranes  moist frothy oral secretions Lungs: Diffuse coarse rhonchi throughout marked accessory use Cardiovascular: Tachycardic regular rhythm Abdomen: Distended, firm, shifting dullness Extremities: Jaundiced appearing, cool, mottled.  Pulses palpable Neuro: Awake but nonverbal GU: Due to void  Resolved Hospital Problem list     Assessment & Plan:   Acute liver failure, superimposed on underlying cirrhosis.  Suspect secondary to acute alcoholic hepatitis. Elevated T-bili. Does have evidence of cirrhosis.  also concern about gallbladder distention however no evidence of obstruction or biliary dilation MELD score 46 Plan CT abdomen pelvis Serial LFTs Treat withdrawal and delirium IV hydration GI consultation, question prednisone versus pentoxifylline Sending ascites for fluid analysis   Acute hepatic and toxic/metabolic encephalopathy -Suspect his acute delirium is primarily secondary to his elevated ammonia level at over 300, however also has acute renal failure, and may also be withdrawing from alcohol Plan Supportive care Cessation of alcohol Rifaximin and lactulose   Acute respiratory failure 2/2 ineffective airway protection and possibly aspiration event.  Plan Intubate/ventilate  PAD protocol RASS goal -1 VAP bundle  PCXR Sputum culture  Empiric abx for aspiration: zosyn day 1/X  Elevated lipase. ? Mild pancreatitis Plan  CT abd/pelvis Repeat Lipase in am   Acute Circulatory shock, r/o sepsis  Plan Transduce CVP IV hydration for CVP > 10 MAP goal > 65 Pan culture  Empiric abx   Acute renal failure: suspect hepatorenal syndrome but could also be volume depleted.  Plan IV hydration Repeat labs  MAP goal > 70  Renal US Renal consul  Repeat UA  Severe anion gap metabolic acidosis (lactic acidosis) in setting of shock, w/ renal failure Plan MAP goal > 70 Ck abg Serial chemistries   Fluid and electrolyte imbalance: hyponatremia  Plan Ck serum and urine  osmo Ck CVP  Cont gentle NaCl  leukocytosis Plan Trend CBC  Coagulopathy: Suspect hepatic Plan Trend INR FFP  Anemia w/out evidence of bleeding Plan Trend CBC    Best practice:  Diet: NPO Pain/Anxiety/Delirium protocol (if indicated): PAD protocol precedex and fent  VAP protocol (if indicated): 1/22 DVT prophylaxis: SCD GI prophylaxis: PPI Glucose control: na Mobility: BR Code Status: full code  Family Communication: pending  Disposition: critically ill.  With multiple organ failure this includes renal, liver, respiratory, and circulatory shock.  We have asked both GI and nephrology to assist with his care.  Labs   CBC: Recent Labs  Lab 07/23/2018 1041  WBC 20.1*  NEUTROABS 16.6*  HGB 10.0*  HCT 28.8*  MCV 99.0  PLT 85*    Basic Metabolic Panel: Recent Labs  Lab 07/15/2018 1010 07/17/2018 1041  NA 122* 122*  K 5.5* 3.9  CL 86* 84*  CO2 11* 13*  GLUCOSE 120* 122*  BUN 64* 65*  CREATININE 4.91* 5.64*  CALCIUM 8.4* 8.4*   GFR: CrCl cannot be calculated (Unknown ideal weight.). Recent Labs  Lab 08/11/2018 1041 07/29/2018 1043  WBC 20.1*  --   LATICACIDVEN  --  10.7*    Liver Function Tests: Recent Labs  Lab 07/18/2018 1041  AST 98*  ALT 54*  ALKPHOS 141*  BILITOT 40.7*  PROT 6.7  ALBUMIN 2.2*   Recent Labs  Lab 07/16/2018 1041  LIPASE 86*   Recent Labs  Lab 07/27/2018 1010  AMMONIA 324*  ABG No results found for: PHART, PCO2ART, PO2ART, HCO3, TCO2, ACIDBASEDEF, O2SAT   Coagulation Profile: Recent Labs  Lab 07/16/2018 1041  INR 3.08    Cardiac Enzymes: No results for input(s): CKTOTAL, CKMB, CKMBINDEX, TROPONINI in the last 168 hours.  HbA1C: No results found for: HGBA1C  CBG: Recent Labs  Lab 07/14/2018 1044  GLUCAP 114*    Review of Systems:   Not able   Past Medical History  He,  has a past medical history of Hypertension and Thyroid disease.   Surgical History   No past surgical history on file.   Social History    reports that he has been smoking. He has been smoking about 0.50 packs per day. He does not have any smokeless tobacco history on file. He reports current alcohol use.   Family History   His family history is not on file.   Allergies Allergies  Allergen Reactions  . Codeine Itching     Home Medications  Prior to Admission medications   Medication Sig Start Date End Date Taking? Authorizing Provider  mirtazapine (REMERON) 15 MG tablet Take 2 tablets (30 mg total) by mouth at bedtime. 06/14/18   Shugart, Lissa Hoard, PA-C  traZODone (DESYREL) 50 MG tablet Take 2 tablets (100 mg total) by mouth at bedtime. 05/24/18   Comer Locket, PA-C     Critical care time: 33 min      Erick Colace ACNP-BC [place] Pager # [phone] OR # [phone] if no answer  Attending Note:  53 year old male with PMH of etoh abuse presenting with acute on chronic liver failure, renal failure, acute encephalopathy and acute respiratory failure.  Patient became hypotensive and required pressors.  On exam, severe jaundice, fluid wave on abdominal exam, black material from the urinary bladder and the NGT.  I reviewed CXR myself, ETT is in a good position.  Discussed with renal, GI and PCCM-NP.  Will admit to the ICU.  Intubate, mechanically ventilate, place TLC, start levophed.  Will likely need HD but will place TLC, give 3 units of FFP then place HD catheter.  Paracentesis cultures.  Aspirated prior to arrival.  Concern for aspiration.  Pan cultures.  Start zosyn.  F/u on cultures.  Lactulose and rifaxamin ordered.  Spoke with daughter, wishes for full code status for now and the rest of the family is to arrive for additional conversations regarding prognosis later.  PCCM will continue to follow.  The patient is critically ill with multiple organ systems failure and requires high complexity decision making for assessment and support, frequent evaluation and titration of therapies, application of  advanced monitoring technologies and extensive interpretation of multiple databases.   Critical Care Time devoted to patient care services described in this note is  90  Minutes. This time reflects time of care of this signee Dr [name]. This critical care time does not reflect procedure time, or teaching time or supervisory time of PA/NP/Med student/Med Resident etc but could involve care discussion time.  Rush Farmer, M.D. [facility]/Critical Care Medicine. Pager: [phone]. After hours pager: [phone].

## 2018-08-02 NOTE — ED Notes (Signed)
CRITICAL VALUE ALERT  Critical Value:  Lactic Acid 10.7  Date & Time Notied:  August 22, 2018, 1107  Provider Notified: Burgess Amor, PA  Orders Received/Actions taken: no new orders at this time

## 2018-08-02 NOTE — Procedures (Signed)
Hemodialysis Catheter Insertion Procedure Note HELMER SLOVER [id number] 1966/06/14  Procedure: Insertion of Hemodialysis Catheter Indications: CRRT  Procedure Details Consent: Risks of procedure as well as the alternatives and risks of each were explained to the (patient/caregiver).  Consent for procedure obtained. Time Out: Verified patient identification, verified procedure, site/side was marked, verified correct patient position, special equipment/implants available, medications/allergies/relevent history reviewed, required imaging and test results available.  Performed  Maximum sterile technique was used including antiseptics, cap, gloves, gown, hand hygiene, mask and sheet. Skin prep: Chlorhexidine; local anesthetic administered A antimicrobial bonded/coated triple lumen catheter was placed in the right internal jugular vein using the Seldinger technique.  Evaluation Blood flow good Complications: No apparent complications Patient did tolerate procedure well. Chest X-ray ordered to verify placement.  CXR: pending.  Procedure performed under direct ultrasound guidance for real time vessel cannulation.      Rutherford Guys, [place] - [facility] [place] [facility] Pgr: [phone]  or [phone] 08/05/2018, 9:19 PM

## 2018-08-03 ENCOUNTER — Inpatient Hospital Stay (HOSPITAL_COMMUNITY): Payer: Medicaid Other

## 2018-08-03 ENCOUNTER — Encounter (HOSPITAL_COMMUNITY): Admission: EM | Disposition: E | Payer: Self-pay | Source: Home / Self Care | Attending: Pulmonary Disease

## 2018-08-03 DIAGNOSIS — J9601 Acute respiratory failure with hypoxia: Secondary | ICD-10-CM

## 2018-08-03 DIAGNOSIS — K7201 Acute and subacute hepatic failure with coma: Secondary | ICD-10-CM

## 2018-08-03 DIAGNOSIS — E440 Moderate protein-calorie malnutrition: Secondary | ICD-10-CM

## 2018-08-03 HISTORY — PX: ESOPHAGOGASTRODUODENOSCOPY: SHX5428

## 2018-08-03 LAB — COMPREHENSIVE METABOLIC PANEL
ALT: 60 U/L — ABNORMAL HIGH (ref 0–44)
ALT: 81 U/L — ABNORMAL HIGH (ref 0–44)
AST: 116 U/L — ABNORMAL HIGH (ref 15–41)
AST: 166 U/L — AB (ref 15–41)
Albumin: 2.9 g/dL — ABNORMAL LOW (ref 3.5–5.0)
Albumin: 2.7 g/dL — ABNORMAL LOW (ref 3.5–5.0)
Alkaline Phosphatase: 79 U/L (ref 38–126)
Alkaline Phosphatase: 87 U/L (ref 38–126)
Anion gap: 24 — ABNORMAL HIGH (ref 5–15)
Anion gap: 16 — ABNORMAL HIGH (ref 5–15)
BUN: 65 mg/dL — ABNORMAL HIGH (ref 6–20)
BUN: 47 mg/dL — AB (ref 6–20)
CHLORIDE: 88 mmol/L — AB (ref 98–111)
CO2: 17 mmol/L — ABNORMAL LOW (ref 22–32)
CO2: 21 mmol/L — ABNORMAL LOW (ref 22–32)
Calcium: 8 mg/dL — ABNORMAL LOW (ref 8.9–10.3)
Calcium: 8.2 mg/dL — ABNORMAL LOW (ref 8.9–10.3)
Chloride: 95 mmol/L — ABNORMAL LOW (ref 98–111)
Creatinine, Ser: 5.89 mg/dL — ABNORMAL HIGH (ref 0.61–1.24)
Creatinine, Ser: 4.49 mg/dL — ABNORMAL HIGH (ref 0.61–1.24)
GFR calc Af Amer: 12 mL/min — ABNORMAL LOW (ref >60)
GFR calc Af Amer: 16 mL/min — ABNORMAL LOW (ref >60)
GFR calc non Af Amer: 10 mL/min — ABNORMAL LOW (ref >60)
GFR calc non Af Amer: 14 mL/min — ABNORMAL LOW (ref >60)
Glucose, Bld: 95 mg/dL (ref 70–99)
Glucose, Bld: 99 mg/dL (ref 70–99)
Potassium: 3.8 mmol/L (ref 3.5–5.1)
Potassium: 3.9 mmol/L (ref 3.5–5.1)
Sodium: 129 mmol/L — ABNORMAL LOW (ref 135–145)
Sodium: 132 mmol/L — ABNORMAL LOW (ref 135–145)
TOTAL PROTEIN: 6 g/dL — AB (ref 6.5–8.1)
Total Bilirubin: 36.5 mg/dL (ref 0.3–1.2)
Total Bilirubin: 38.4 mg/dL (ref 0.3–1.2)
Total Protein: 5.9 g/dL — ABNORMAL LOW (ref 6.5–8.1)

## 2018-08-03 LAB — PREPARE FRESH FROZEN PLASMA: Unit division: 0

## 2018-08-03 LAB — POCT I-STAT 7, (LYTES, BLD GAS, ICA,H+H)
Acid-Base Excess: 4 mmol/L — ABNORMAL HIGH (ref 0.0–2.0)
BICARBONATE: 25.2 mmol/L (ref 20.0–28.0)
Calcium, Ion: 1.01 mmol/L — ABNORMAL LOW (ref 1.15–1.40)
HCT: 33 % — ABNORMAL LOW (ref 39.0–52.0)
Hemoglobin: 11.2 g/dL — ABNORMAL LOW (ref 13.0–17.0)
O2 Saturation: 100 %
PO2 ART: 229 mmHg — AB (ref 83.0–108.0)
Patient temperature: 98
Potassium: 3.9 mmol/L (ref 3.5–5.1)
Sodium: 132 mmol/L — ABNORMAL LOW (ref 135–145)
TCO2: 26 mmol/L (ref 22–32)
pCO2 arterial: 27.2 mmHg — ABNORMAL LOW (ref 32.0–48.0)
pH, Arterial: 7.573 — ABNORMAL HIGH (ref 7.350–7.450)

## 2018-08-03 LAB — BODY FLUID CELL COUNT WITH DIFFERENTIAL
EOS FL: 1 %
Lymphs, Fluid: 1 %
Monocyte-Macrophage-Serous Fluid: 30 % — ABNORMAL LOW (ref 50–90)
Neutrophil Count, Fluid: 69 % — ABNORMAL HIGH (ref 0–25)
Total Nucleated Cell Count, Fluid: 5475 cu mm — ABNORMAL HIGH (ref 0–1000)

## 2018-08-03 LAB — BLOOD GAS, ARTERIAL
Acid-base deficit: 4.9 mmol/L — ABNORMAL HIGH (ref 0.0–2.0)
Acid-base deficit: 3.2 mmol/L — ABNORMAL HIGH (ref 0.0–2.0)
BICARBONATE: 18.7 mmol/L — AB (ref 20.0–28.0)
Bicarbonate: 17.2 mmol/L — ABNORMAL LOW (ref 20.0–28.0)
DRAWN BY: 21338
Drawn by: 364961
FIO2: 40
FIO2: 40
LHR: 16 {breaths}/min
MECHVT: 690 mL
MECHVT: 600 mL
O2 Saturation: 99.6 %
O2 Saturation: 99.6 %
PATIENT TEMPERATURE: 98.1
PCO2 ART: 19.7 mmHg — AB (ref 32.0–48.0)
PEEP/CPAP: 5 cmH2O
PEEP: 5 cmH2O
Patient temperature: 97.2
RATE: 22 resp/min
pCO2 arterial: 19.5 mmHg — CL (ref 32.0–48.0)
pH, Arterial: 7.55 — ABNORMAL HIGH (ref 7.350–7.450)
pH, Arterial: 7.584 — ABNORMAL HIGH (ref 7.350–7.450)
pO2, Arterial: 147 mmHg — ABNORMAL HIGH (ref 83.0–108.0)
pO2, Arterial: 173 mmHg — ABNORMAL HIGH (ref 83.0–108.0)

## 2018-08-03 LAB — PROTEIN, PLEURAL OR PERITONEAL FLUID: Total protein, fluid: <3 g/dL

## 2018-08-03 LAB — RENAL FUNCTION PANEL
Albumin: 2.7 g/dL — ABNORMAL LOW (ref 3.5–5.0)
Anion gap: 17 — ABNORMAL HIGH (ref 5–15)
BUN: 46 mg/dL — ABNORMAL HIGH (ref 6–20)
CO2: 20 mmol/L — ABNORMAL LOW (ref 22–32)
Calcium: 8.2 mg/dL — ABNORMAL LOW (ref 8.9–10.3)
Chloride: 95 mmol/L — ABNORMAL LOW (ref 98–111)
Creatinine, Ser: 4.35 mg/dL — ABNORMAL HIGH (ref 0.61–1.24)
GFR calc Af Amer: 17 mL/min — ABNORMAL LOW (ref >60)
GFR calc non Af Amer: 14 mL/min — ABNORMAL LOW (ref >60)
Glucose, Bld: 100 mg/dL — ABNORMAL HIGH (ref 70–99)
POTASSIUM: 3.9 mmol/L (ref 3.5–5.1)
Phosphorus: 5.6 mg/dL — ABNORMAL HIGH (ref 2.5–4.6)
Sodium: 132 mmol/L — ABNORMAL LOW (ref 135–145)

## 2018-08-03 LAB — CBC
HCT: 19 % — ABNORMAL LOW (ref 39.0–52.0)
HCT: 24.6 % — ABNORMAL LOW (ref 39.0–52.0)
Hemoglobin: 7 g/dL — ABNORMAL LOW (ref 13.0–17.0)
Hemoglobin: 9.2 g/dL — ABNORMAL LOW (ref 13.0–17.0)
MCH: 35.4 pg — ABNORMAL HIGH (ref 26.0–34.0)
MCH: 34.8 pg — ABNORMAL HIGH (ref 26.0–34.0)
MCHC: 36.8 g/dL — ABNORMAL HIGH (ref 30.0–36.0)
MCHC: 37.4 g/dL — ABNORMAL HIGH (ref 30.0–36.0)
MCV: 96 fL (ref 80.0–100.0)
MCV: 93.2 fL (ref 80.0–100.0)
Platelets: 66 10*3/uL — ABNORMAL LOW (ref 150–400)
Platelets: UNDETERMINED 10*3/uL (ref 150–400)
RBC: 1.98 MIL/uL — ABNORMAL LOW (ref 4.22–5.81)
RBC: 2.64 MIL/uL — ABNORMAL LOW (ref 4.22–5.81)
RDW: 15.6 % — ABNORMAL HIGH (ref 11.5–15.5)
RDW: 15.8 % — ABNORMAL HIGH (ref 11.5–15.5)
WBC: 13.2 10*3/uL — ABNORMAL HIGH (ref 4.0–10.5)
WBC: 9.3 10*3/uL (ref 4.0–10.5)
nRBC: 0 % (ref 0.0–0.2)
nRBC: 0 % (ref 0.0–0.2)

## 2018-08-03 LAB — BPAM FFP
Blood Product Expiration Date: 202001272359
Blood Product Expiration Date: 202001272359
ISSUE DATE / TIME: 202001222003
ISSUE DATE / TIME: 202001222044
Unit Type and Rh: 5100
Unit Type and Rh: 5100

## 2018-08-03 LAB — GLUCOSE, PLEURAL OR PERITONEAL FLUID: GLUCOSE FL: 80 mg/dL

## 2018-08-03 LAB — GLUCOSE, CAPILLARY
GLUCOSE-CAPILLARY: 83 mg/dL (ref 70–99)
GLUCOSE-CAPILLARY: 87 mg/dL (ref 70–99)
Glucose-Capillary: 127 mg/dL — ABNORMAL HIGH (ref 70–99)
Glucose-Capillary: 66 mg/dL — ABNORMAL LOW (ref 70–99)
Glucose-Capillary: 91 mg/dL (ref 70–99)
Glucose-Capillary: 81 mg/dL (ref 70–99)
Glucose-Capillary: 95 mg/dL (ref 70–99)

## 2018-08-03 LAB — POTASSIUM: Potassium: 3.8 mmol/L (ref 3.5–5.1)

## 2018-08-03 LAB — ALBUMIN, PLEURAL OR PERITONEAL FLUID: Albumin, Fluid: <1 g/dL

## 2018-08-03 LAB — AMYLASE, PLEURAL OR PERITONEAL FLUID: Amylase, Fluid: 18 U/L

## 2018-08-03 LAB — HIV ANTIBODY (ROUTINE TESTING W REFLEX): HIV Screen 4th Generation wRfx: NONREACTIVE

## 2018-08-03 LAB — PREPARE RBC (CROSSMATCH)

## 2018-08-03 LAB — HEMOGLOBIN AND HEMATOCRIT, BLOOD
HCT: 23.2 % — ABNORMAL LOW (ref 39.0–52.0)
Hemoglobin: 7.9 g/dL — ABNORMAL LOW (ref 13.0–17.0)

## 2018-08-03 LAB — LACTATE DEHYDROGENASE, PLEURAL OR PERITONEAL FLUID: LD, Fluid: 169 U/L — ABNORMAL HIGH (ref 3–23)

## 2018-08-03 LAB — BASIC METABOLIC PANEL
Anion gap: 18 — ABNORMAL HIGH (ref 5–15)
BUN: 59 mg/dL — ABNORMAL HIGH (ref 6–20)
CO2: 17 mmol/L — ABNORMAL LOW (ref 22–32)
Calcium: 7.2 mg/dL — ABNORMAL LOW (ref 8.9–10.3)
Chloride: 89 mmol/L — ABNORMAL LOW (ref 98–111)
Creatinine, Ser: 5.43 mg/dL — ABNORMAL HIGH (ref 0.61–1.24)
GFR calc Af Amer: 13 mL/min — ABNORMAL LOW (ref >60)
GFR calc non Af Amer: 11 mL/min — ABNORMAL LOW (ref >60)
Glucose, Bld: 82 mg/dL (ref 70–99)
Potassium: >7.5 mmol/L (ref 3.5–5.1)
Sodium: 124 mmol/L — ABNORMAL LOW (ref 135–145)

## 2018-08-03 LAB — LACTATE DEHYDROGENASE: LDH: 183 U/L (ref 98–192)

## 2018-08-03 LAB — LACTIC ACID, PLASMA
Lactic Acid, Venous: 8.8 mmol/L (ref 0.5–1.9)
Lactic Acid, Venous: 6.5 mmol/L (ref 0.5–1.9)

## 2018-08-03 LAB — AMMONIA: Ammonia: 139 umol/L — ABNORMAL HIGH (ref 9–35)

## 2018-08-03 LAB — LIPASE, BLOOD: Lipase: 291 U/L — ABNORMAL HIGH (ref 11–51)

## 2018-08-03 LAB — MRSA PCR SCREENING: MRSA BY PCR: NEGATIVE

## 2018-08-03 LAB — BILIRUBIN, DIRECT: Bilirubin, Direct: 23.2 mg/dL — ABNORMAL HIGH (ref 0.0–0.2)

## 2018-08-03 LAB — PHOSPHORUS: Phosphorus: 7.4 mg/dL — ABNORMAL HIGH (ref 2.5–4.6)

## 2018-08-03 LAB — MAGNESIUM: Magnesium: 1.8 mg/dL (ref 1.7–2.4)

## 2018-08-03 SURGERY — EGD (ESOPHAGOGASTRODUODENOSCOPY)
Anesthesia: Moderate Sedation

## 2018-08-03 MED ORDER — SODIUM CHLORIDE 0.9 % IV SOLN
INTRAVENOUS | Status: DC
  Administered 2018-08-03 – 2018-08-06 (×3): via INTRAVENOUS

## 2018-08-03 MED ORDER — DEXTROSE 50 % IV SOLN
12.5000 g | INTRAVENOUS | Status: AC
  Administered 2018-08-03: 12.5 g via INTRAVENOUS

## 2018-08-03 MED ORDER — HEPARIN SODIUM (PORCINE) 1000 UNIT/ML DIALYSIS
1000.0000 [IU] | INTRAMUSCULAR | Status: DC | PRN
  Filled 2018-08-03: qty 4
  Filled 2018-08-03 (×2): qty 6

## 2018-08-03 MED ORDER — MIDAZOLAM HCL (PF) 10 MG/2ML IJ SOLN
INTRAMUSCULAR | Status: DC | PRN
  Administered 2018-08-03: 2 mg via INTRAVENOUS

## 2018-08-03 MED ORDER — MIDAZOLAM HCL 2 MG/2ML IJ SOLN
INTRAMUSCULAR | Status: AC
  Filled 2018-08-03: qty 2

## 2018-08-03 MED ORDER — FENTANYL CITRATE (PF) 100 MCG/2ML IJ SOLN
INTRAMUSCULAR | Status: AC
  Filled 2018-08-03: qty 6

## 2018-08-03 MED ORDER — PANTOPRAZOLE SODIUM 40 MG IV SOLR: 40.0000 mg | Freq: Two times a day (BID) | INTRAVENOUS | Status: DC

## 2018-08-03 MED ORDER — SODIUM CHLORIDE 0.9% IV SOLUTION: Freq: Once | INTRAVENOUS | Status: AC

## 2018-08-03 MED ORDER — DEXTROSE 50 % IV SOLN
INTRAVENOUS | Status: AC
  Administered 2018-08-03: 12.5 g via INTRAVENOUS
  Filled 2018-08-03: qty 50

## 2018-08-03 MED ORDER — SODIUM CHLORIDE 0.9% IV SOLUTION
Freq: Once | INTRAVENOUS | Status: AC
  Administered 2018-08-03: 14:00:00 via INTRAVENOUS

## 2018-08-03 MED ORDER — VASOPRESSIN 20 UNIT/ML IV SOLN
0.0300 [IU]/min | INTRAVENOUS | Status: DC
  Administered 2018-08-03 – 2018-08-05 (×3): 0.03 [IU]/min via INTRAVENOUS
  Filled 2018-08-03 (×4): qty 2

## 2018-08-03 MED ORDER — DIPHENHYDRAMINE HCL 50 MG/ML IJ SOLN
INTRAMUSCULAR | Status: AC
  Filled 2018-08-03: qty 1

## 2018-08-03 MED ORDER — MIDAZOLAM HCL 2 MG/2ML IJ SOLN
1.0000 mg | INTRAMUSCULAR | Status: AC | PRN
  Administered 2018-08-03 – 2018-08-04 (×2): 1 mg via INTRAVENOUS
  Filled 2018-08-03 (×2): qty 2

## 2018-08-03 MED ORDER — ARTIFICIAL TEARS OPHTHALMIC OINT
TOPICAL_OINTMENT | OPHTHALMIC | Status: DC | PRN
  Administered 2018-08-03: 1 via OPHTHALMIC
  Administered 2018-08-05 – 2018-08-06 (×2): via OPHTHALMIC
  Filled 2018-08-03: qty 3.5

## 2018-08-03 MED ORDER — PRISMASOL BGK 4/2.5 32-4-2.5 MEQ/L REPLACEMENT SOLN
Status: DC
  Administered 2018-08-03 – 2018-08-06 (×6): via INTRAVENOUS_CENTRAL
  Filled 2018-08-03 (×8): qty 5000

## 2018-08-03 MED ORDER — SODIUM CHLORIDE 0.9 % IV SOLN: INTRAVENOUS | Status: DC

## 2018-08-03 MED ORDER — SODIUM CHLORIDE 0.9 % IV SOLN
8.0000 mg/h | INTRAVENOUS | Status: DC
  Administered 2018-08-03 – 2018-08-04 (×3): 8 mg/h via INTRAVENOUS
  Filled 2018-08-03 (×4): qty 80

## 2018-08-03 MED ORDER — PIPERACILLIN-TAZOBACTAM 3.375 G IVPB 30 MIN
3.3750 g | Freq: Four times a day (QID) | INTRAVENOUS | Status: DC
  Administered 2018-08-03 – 2018-08-06 (×14): 3.375 g via INTRAVENOUS
  Filled 2018-08-03 (×15): qty 50

## 2018-08-03 MED ORDER — FENTANYL CITRATE (PF) 100 MCG/2ML IJ SOLN
INTRAMUSCULAR | Status: DC | PRN
  Administered 2018-08-03: 25 ug via INTRAVENOUS

## 2018-08-03 MED ORDER — CALCIUM GLUCONATE 10 % IV SOLN: 1.0000 g | Freq: Once | INTRAVENOUS | Status: DC

## 2018-08-03 MED ORDER — OCTREOTIDE LOAD VIA INFUSION
50.0000 ug | Freq: Once | INTRAVENOUS | Status: AC
  Administered 2018-08-03: 50 ug via INTRAVENOUS
  Filled 2018-08-03: qty 25

## 2018-08-03 MED ORDER — MIDAZOLAM HCL (PF) 5 MG/ML IJ SOLN
INTRAMUSCULAR | Status: AC
  Filled 2018-08-03: qty 3

## 2018-08-03 MED ORDER — SODIUM CHLORIDE 0.9 % IV SOLN
80.0000 mg | Freq: Once | INTRAVENOUS | Status: AC
  Administered 2018-08-03: 80 mg via INTRAVENOUS
  Filled 2018-08-03: qty 80

## 2018-08-03 MED ORDER — SODIUM CHLORIDE 0.9 % IV SOLN
50.0000 ug/h | INTRAVENOUS | Status: DC
  Administered 2018-08-03: 50 ug/h via INTRAVENOUS
  Filled 2018-08-03 (×2): qty 1

## 2018-08-03 MED ORDER — PRISMASOL BGK 4/2.5 32-4-2.5 MEQ/L REPLACEMENT SOLN
Status: DC
  Administered 2018-08-03 – 2018-08-06 (×4): via INTRAVENOUS_CENTRAL
  Filled 2018-08-03 (×4): qty 5000

## 2018-08-03 MED ORDER — PRISMASOL BGK 4/2.5 32-4-2.5 MEQ/L IV SOLN
INTRAVENOUS | Status: DC
  Administered 2018-08-03 – 2018-08-06 (×39): via INTRAVENOUS_CENTRAL
  Filled 2018-08-03 (×48): qty 5000

## 2018-08-03 NOTE — Progress Notes (Signed)
Pharmacy Antibiotic Note  Jacob Mullen is a 53 y.o. male admitted on 08/03/2018 with pneumonia and peritonitis.  Pharmacy has been consulted for zosyn dosing.  Starting CRRT 1/23  Plan: Change Zosyn to 3.375gm IV q6h - each dose over 30 minutes Will f/u CRRT tolerance, micro data, and pt's clinical condition   Height: 6\' 4"  (193 cm) Weight: 230 lb (104.3 kg) IBW/kg (Calculated) : 86.8  Temp (24hrs), Avg:97.5 F (36.4 C), Min:95.5 F (35.3 C), Max:98.1 F (36.7 C)  Recent Labs  Lab 07/17/2018 1010 07/13/2018 1041 08/10/2018 1043 08/05/2018 1834 08/01/2018 1954 07/13/2018 2230 07/23/2018 2239  WBC  --  20.1*  --   --   --   --   --   CREATININE 4.91* 5.64*  --  6.45* 6.46*  --  6.84*  LATICACIDVEN  --   --  10.7* 8.9*  --  10.6*  --     Estimated Creatinine Clearance: 16.4 mL/min (A) (by C-G formula based on SCr of 6.84 mg/dL (H)).    Allergies  Allergen Reactions  . Codeine Itching    Antimicrobials this admission: 1/22 vanc x1 1/22 cefepime x1 1/22 zosyn>>  Dose adjustments this admission:   Microbiology results: 1/22 blood>> 1/22 peritoneal>> 1/22 resp>>  Christoper Fabian, PharmD, BCPS Clinical pharmacist  **Pharmacist phone directory can now be found on [email or web address] ([email or web address]).  Listed under [facility]. 08-16-2018 1:16 AM

## 2018-08-03 NOTE — Interval H&P Note (Signed)
History and Physical Interval Note:  07/21/2018 5:23 PM  Jacob Mullen  has presented today for surgery, with the diagnosis of Upper GI Bleed  The various methods of treatment have been discussed with the patient and family. After consideration of risks, benefits and other options for treatment, the patient has consented to  Procedure(s) with comments: ESOPHAGOGASTRODUODENOSCOPY (EGD) (N/A) - At bedside. as a surgical intervention .  The patient's history has been reviewed, patient examined, no change in status, stable for surgery.  I have reviewed the patient's chart and labs.  Questions were answered to the patient's satisfaction.     Corinda Ammon D

## 2018-08-03 NOTE — Progress Notes (Signed)
CRITICAL VALUE ALERT  Critical Value:  CO2 19.5  Date & Time Notied:  2018/08/11 at 0926  Provider Notified: Dr. Nelson Chimes  Orders Received/Actions taken: MD decreased RR to 12.

## 2018-08-03 NOTE — Progress Notes (Signed)
eLink Physician-Brief Progress Note Patient Name: Jacob Mullen DOB: [birth year] MRN: [id number]   Date of Service  09-Aug-2018  HPI/Events of Note  Respiratory alkalosis exacerbated by sodium bicarbonate infusion at 150 ml  eICU Interventions  Reduce respiratory rate to 16, reduce bicarbonate infusion rate to 50 ml/hr, ABG at 9 AM        Tilly Pernice U Madysin Crisp 08-09-2018, 5:40 AM

## 2018-08-03 NOTE — Procedures (Signed)
Paracentesis Procedure Note  Indications:  Liver failure  Procedure Details  Informed consent was obtained after explanation of the risks and benefits of the procedure, refer to the consent documentation.  Time-out was performed immediately prior to the procedure.  The head of the bed was placed 30-45 degrees above level and the meniscus of the ascites was evaluated by bedside ultrasound and a large area of ascitic fluid was identified in the right mid quadrant with [place].  Local anesthesia with 1 percent lidocaine was introduced subcutaneously then deep to the skin until the parietal peritoneum was anesthetized. A parcentesis needle was introduced into this site until ascitic fluid was encountered.  Ascitic fluid and the needle were removed with minimal bleeding.  A sterile bandage was placed after holding pressure.    Right mid quadrant 08/17/2018 Findings: of free fluid yellow colored ascites fluid was obtained.  The ascites fluid was sent for labs.        Condition:   The patient tolerated the procedure well and remains in the same condition as pre-procedure.  Complications: None; patient tolerated the procedure well.  Arnetha Courser PGY3

## 2018-08-03 NOTE — Progress Notes (Addendum)
NAME:  MERICK KELLEHER, MRN:  [id number], DOB:  Jun 08, 1966, LOS: 1 ADMISSION DATE:  07/22/2018, CONSULTATION DATE:  1/22 REFERRING MD:  Alfonzo Feller, CHIEF COMPLAINT:  Acute metabolic encephaloapthy    Brief History   50 yom w/ sig h/o ETOH, recently identified elevated LFTs and bilirubin w/ [place] raising concern for chronic hepatocellular disease & cirrhosis as well as distended GB w/out gallstones, biliary dilation or wall thickening. Admitted acutely on 1/22 after being found by mother encephalopathic. Lab findings showing: ammonia 324, INR 3.08, new renal failure w/ cr 5.64, rising t-bilirubin from 26.8 to 40.7 and increased lipase. PCCM asked to admit given multiple metabolic derangements and hepato-renal failure   History of present illness    53 year old male patient with known history of  Presented to [facility] initially on 1/8 with chief complaint of jaundice, marked by yellowing of the eyes, darkening urine and worsening appetite for about a 2-week timeframe.  He has had some progressive weakness.  Admitted to drinking 4 to 512 ounce beers daily, and has reported withdrawal type symptoms and has been admitted for withdrawal in the past.  In the ER he was found to have elevated bilirubin and mildly elevated LFTs and ultrasound of the abdomen was checked, it was negative for obstruction he was instructed to abstain from alcohol, and obtain GI outpatient consultation.  He presents to the emergency room on 1/22 at [facility] with chief complaint of altered mental status.  He apparently continues to drink.  He was apparently found by his mother the a.m. of 1/22 agitated and confused, she did find some dark appearing emesis in the house when she went to pick him up.  Diagnostic evaluation in the emergency room demonstrated the following: Lipase elevated from 45 up to 86, ammonia level 324, AST 98, ALT 54, total bilirubin 40.7 with alk phos of 141, sodium of 122, BUN and creatinine acutely elevated up to 5.64 and  65.  Because of his profound metabolic derangements and acute hepatic and metabolic encephalopathy he was transferred to [facility] for further evaluation and sub-specialty care.  Past Medical History  ETOH   Significant Hospital Events   1/22: Admitted with acute encephalopathy felt metabolic and hepatic in nature, acute renal failure, profound lactic acidosis, ineffective airway management, and profound jaundice.  Consults:  GI medicine 1/22  Procedures:  Oral endotracheal tube 1/22 Central line 1/22 HD catheter 1/22 Paracentesis 1/23  Significant Diagnostic Tests:  Abdominal ultrasound 1/8: Heterogenous hepatic and echogenicity suggesting steatosis versus chronic hepatocellular disease.  Mild micronodular contours of the liver suggesting cirrhosis.  There is trace perihepatic ascites.  The gallbladder was distended, irregular in shape but there were no gallstones or wall thickening there was no biliary dilation 1/22: Lipase 86 up from 45 2 weeks prior, ammonia 324, Acetaminophen less than 10, lactic acid 10.7, INR 3.08, alk phos 141 down from 162, total bilirubin 40.7 up from 26.8, serum creatinine 5.64 CT abdomen pelvis 1/22 Micro Data:  Blood cultures times two 1/22 Respiratory culture 1/22>>> Ascites 1/22>>>  Antimicrobials:  Zosyn 1/22  Interim history/subjective:  Sedated, not responding to any commands. There was some concern of agitation overnight. Continue to have coffee-ground emesis and OG tube and drop in hemoglobin.  Objective   Blood pressure (!) 110/48, pulse 69, temperature (!) 97.2 F (36.2 C), temperature source Axillary, resp. rate 17, height '6\' 4"'$  (1.93 m), weight 104.3 kg, SpO2 100 %. CVP:  [8 mmHg-12 mmHg] 12 mmHg  Vent Mode: PRVC  FiO2 (%):  [40 %-100 %] 40 % Set Rate:  [16 bmp-22 bmp] 16 bmp Vt Set:  [600 mL-690 mL] 600 mL PEEP:  [5 cmH20] 5 cmH20 Plateau Pressure:  [19 cmH20] 19 cmH20   Intake/Output Summary (Last 24 hours) at 07/15/2018 1228 Last data  filed at 07/24/2018 1200 Gross per 24 hour  Intake 4152.08 ml  Output 1783 ml  Net 2369.08 ml   Filed Weights   07/16/2018 1002 07/13/2018 0452  Weight: 104.3 kg 104.3 kg    Examination: General: Jaundiced appearing white male sclera completely icteric, intubated and sedated, is in acute distress HENT: Normocephalic, atraumatic ,scleral  icterous, mucous membranes moist frothy oral secretions Lungs: Clear bilaterally with decreased breath sounds at bases. Cardiovascular: Regular rate and rhythm, no murmurs. Abdomen: Distended, firm, shifting dullness. Extremities: Jaundiced appearing, cool,  Pulses palpable Neuro: Sedated, not following any commands. GU: Foley in place.  Resolved Hospital Problem list     Assessment & Plan:   Acute liver failure, superimposed on underlying cirrhosis.  Suspect secondary to acute alcoholic hepatitis. Elevated T-bili. Acute hepatic and toxic/metabolic encephalopathy    also concern about gallbladder distention however no evidence of obstruction or biliary dilation, 1 nonobstructing gallstone on CT. MELD score 46, discriminant function score was 189.2. CT abdomen shows liver cirrhosis with moderate ascites and small bilateral pleural effusion more on right.  Contracted gallbladder with 5 mm nonobstructing gallstone. Liver enzymes are not markedly elevated making hepatitis less likely. INR continue to rise, 4.95 today, consistent with liver failure. Paracentesis with removal of 1500 mL at bedside-pending labs. Plan Serial LFTs Serial INR Treat withdrawal and delirium IV hydration Start him on lactulose and rifaximin once able to tolerate through the OG-tube. Zosyn for possible SBP.  Hematemesis.  Continue to have coffee-ground emesis and OG tube with hemoglobin dropped to 7, it was 12.5 on 1/8. GI to perform EGD this afternoon. -Started him on octreotide and Protonix infusion. -Transfuse 2 units of packed RBCs. -Monitor CBC with transfusion goal  of 7.  Acute respiratory failure 2/2 ineffective airway protection and possibly aspiration event.  Plan Intubate/ventilate  PAD protocol RASS goal -1 VAP bundle  PCXR Sputum culture  Empiric abx for aspiration: zosyn   Elevated lipase. ? Mild pancreatitis Plan  CT abd/pelvis-does not show any pancreatitis.  Acute Circulatory shock, r/o sepsis  Plan Transduce CVP IV hydration for CVP > 10 MAP goal > 65 Pan culture  Empiric abx   Acute renal failure: suspect hepatorenal syndrome but could also be volume depleted.  Plan. Nephrology was consulted- appreciate their recommendations. Patient was placed on CRRT. IV hydration Repeat labs  MAP goal > 70   Severe anion gap metabolic acidosis (lactic acidosis) in setting of shock, w/ renal failure Plan MAP goal > 70 Ck abg Serial chemistries   Fluid and electrolyte imbalance: hyponatremia  Plan Ck serum and urine osmo Ck CVP  Cont gentle NaCl  leukocytosis Plan Trend CBC  Coagulopathy: Suspect hepatic Plan Trend INR FFP  Anemia w/out evidence of bleeding Plan Trend CBC    Best practice:  Diet: NPO Pain/Anxiety/Delirium protocol (if indicated): PAD protocol precedex and fent  VAP protocol (if indicated): 1/22 DVT prophylaxis: SCD GI prophylaxis: PPI Glucose control: na Mobility: BR Code Status: full code  Family Communication: pending  Disposition: critically ill.  With multiple organ failure this includes renal, liver, respiratory, and circulatory shock.  We have asked both GI and nephrology to assist with his care.  Labs  CBC: Recent Labs  Lab 07/20/2018 1041 07/13/2018 1749 07/18/2018 0320 07/21/2018 0748  WBC 20.1*  --  13.2*  --   NEUTROABS 16.6*  --   --   --   HGB 10.0* 10.2* 7.0* 7.9*  HCT 28.8* 30.0* 19.0* 23.2*  MCV 99.0  --  96.0  --   PLT 85*  --  66*  --     Basic Metabolic Panel: Recent Labs  Lab 07/24/2018 1834 07/20/2018 1954 07/29/2018 2239 08/10/2018 0320 08/11/2018 0748  NA 120* 121*  127* 129* 124*  K >7.5* >7.5* 4.0 3.8 >7.5*  CL 85* 85* 86* 88* 89*  CO2 16* 16* 14* 17* 17*  GLUCOSE 85 84 91 95 82  BUN 70* 71* 75* 65* 59*  CREATININE 6.45* 6.46* 6.84* 5.89* 5.43*  CALCIUM 7.3* 7.2* 8.2* 8.0* 7.2*  MG 2.0  --   --  1.8  --   PHOS 11.3*  --   --  7.4*  --    GFR: Estimated Creatinine Clearance: 20.6 mL/min (A) (by C-G formula based on SCr of 5.43 mg/dL (H)). Recent Labs  Lab 07/18/2018 1041 07/17/2018 1043 07/27/2018 1834 07/22/2018 2230 07/19/2018 0320 07/28/2018 0748  PROCALCITON  --   --  2.70  --   --   --   WBC 20.1*  --   --   --  13.2*  --   LATICACIDVEN  --  10.7* 8.9* 10.6*  --  8.8*    Liver Function Tests: Recent Labs  Lab 07/18/2018 1041 07/25/2018 0320  AST 98* 116*  ALT 54* 60*  ALKPHOS 141* 79  BILITOT 40.7* 36.5*  PROT 6.7 6.0*  ALBUMIN 2.2* 2.9*   Recent Labs  Lab 07/21/2018 1041 07/16/2018 1834 07/19/2018 0320  LIPASE 86*  --  291*  AMYLASE  --  331*  --    Recent Labs  Lab 07/18/2018 1010 07/12/2018 0320  AMMONIA 324* 139*    ABG    Component Value Date/Time   PHART 7.584 (H) 07/27/2018 0900   PCO2ART 19.5 (LL) 07/21/2018 0900   PO2ART 173 (H) 07/29/2018 0900   HCO3 18.7 (L) 08/08/2018 0900   TCO2 21 (L) 08/01/2018 1749   ACIDBASEDEF 3.2 (H) 08/07/2018 0900   O2SAT 99.6 08/10/2018 0900     Coagulation Profile: Recent Labs  Lab 07/24/2018 1041 07/19/2018 1834 07/27/2018 0748  INR 3.08 3.43 4.95*    Cardiac Enzymes: No results for input(s): CKTOTAL, CKMB, CKMBINDEX, TROPONINI in the last 168 hours.  HbA1C: No results found for: HGBA1C  CBG: Recent Labs  Lab 07/20/2018 0102 07/18/2018 0127 07/14/2018 0325 08/07/2018 0815 07/15/2018 1210  GLUCAP 66* 127* 91 81 83    Review of Systems:   Not able   Past Medical History  He,  has a past medical history of Hypertension and Thyroid disease.   Surgical History   No past surgical history on file.   Social History   reports that he has been smoking. He has been smoking about 0.50  packs per day. He does not have any smokeless tobacco history on file. He reports current alcohol use.   Family History   His family history is not on file.   Allergies Allergies  Allergen Reactions  . Codeine Itching     Home Medications  Prior to Admission medications   Medication Sig Start Date End Date Taking? Authorizing Provider  mirtazapine (REMERON) 15 MG tablet Take 2 tablets (30 mg total) by mouth at bedtime. 06/14/18  Shugart, Clay, PA-C  traZODone (DESYREL) 50 MG tablet Take 2 tablets (100 mg total) by mouth at bedtime. 05/24/18   Comer Locket, PA-C     Critical care time:      Lorella Nimrod MD PGY3 Pager [phone]

## 2018-08-03 NOTE — Progress Notes (Signed)
I had a long discussion with the patient's mother and sisters.  All of their questions and concerns were addressed.  The patient is critically ill.  His INR has worsened and he was just diagnosed with SBP.  The likelihood of any meaningful recover from his hepatic failure is very low.  I was up front and honest with the family.  If there is no significant improvement in the next couple of days, the family should consider comfort care.

## 2018-08-03 NOTE — Progress Notes (Signed)
CRITICAL VALUE ALERT  Critical Value:  Lactic acid 6.5  Date & Time Notied:  06/23/2019 at 1350  Provider Notified: Dr. Nelson ChimesAmin

## 2018-08-03 NOTE — Progress Notes (Signed)
Contacted on my cell phone by critical care.  They had been paging without success as my pager was not receiving pages for approximately 3 hrs.   Pt with hyperkalemia on pressor with AKI and minimal UOP in the setting of acute liver injury.  Pt in need of CRRT- orders written.  If there is anything needed at all please do not hesitate to call my my cell phone- number listed below.  Bufford Buttner MD [employer] Cell [phone]

## 2018-08-03 NOTE — Progress Notes (Signed)
Initial Nutrition Assessment  DOCUMENTATION CODES:   Non-severe (moderate) malnutrition in context of chronic illness  INTERVENTION:   Recommend initiation of tube feeding as soon as medically able  Tube Feeding Recommendations:  Vital 1.5 @ 50 ml/hr Pro-Stat 30 mL 5 times daily Provides 2300 kcals 157 g of protein and 912 mL of free water  Recommend adding B-complex with Vitamin C  NUTRITION DIAGNOSIS:   Moderate Malnutrition related to chronic illness(cirrhosis, EtOH abuse) as evidenced by mild fat depletion, moderate muscle depletion, edema.   GOAL:   Patient will meet greater than or equal to 90% of their needs  MONITOR:   Vent status, Labs, Weight trends  REASON FOR ASSESSMENT:   Ventilator    ASSESSMENT:   53 yo male admitted with acute encephalopathy with acute liver failure superimposed on underlying cirrhosis with severe jaundice with possible hepatorenal syndrome, AKI requiring initiation of CRRT, acute respiratory failure 2/2 ineffective airway protection and possible aspiration event requiring intubation. PMH includes h/o EtOH abuse, HTN, thyroid disease  1/22 Admit, Intubated 1/23 CRRT initiated, Paracentesis with 1.5 L removed  Patient is currently intubated on ventilator support MV: 16 L/min Temp (24hrs), Avg:97.3 F (36.3 C), Min:96 F (35.6 C), Max:98.1 F (36.7 C)  Daughter and Ex-Girlfriend at bedside. She does not know if pt has been eating well recently but does report that pt goes to eat dinner with his mother every night (unsure how much he eats however). They do indicate that he admits to drinking 5-6 beers per night. Daughter reports he "looks as though he has lost weight" but unsure of UBW. Ex-Girlfriend reports she believes his UBW around 240-250 pounds. Current wt 229 pounds (prior to paracentesis). Noted ascites, abdomen distended/taut. Also noted generalized edema. Unsure of actual dry wt    OG tube in place, dark reddish black output  via OG; 600 mL in cannister  Hyperkalemia, hyperphosphatemia should improve with initiation of CRRT  Labs: sodium 124, potassium >7.5 (H), phosphorus 7.4 (H), lipase 291 Meds: folic acid, lactulose   NUTRITION - FOCUSED PHYSICAL EXAM:    Most Recent Value  Orbital Region  Mild depletion  Upper Arm Region  Mild depletion  Thoracic and Lumbar Region  Unable to assess  Buccal Region  Unable to assess  Temple Region  Moderate depletion  Clavicle Bone Region  Moderate depletion  Clavicle and Acromion Bone Region  Moderate depletion  Scapular Bone Region  Moderate depletion  Dorsal Hand  Unable to assess [edematous]  Patellar Region  Unable to assess  Anterior Thigh Region  Unable to assess  Posterior Calf Region  Unable to assess  Edema (RD Assessment)  Mild [generaized non-pitting]  Hair  Unable to assess  Eyes  -- [jaundice]  Mouth  -- [bleeding]  Skin  -- [severe jaundice]  Nails  Reviewed       Diet Order:   Diet Order            Diet NPO time specified  Diet effective now              EDUCATION NEEDS:   Not appropriate for education at this time  Skin:  Skin Assessment: Reviewed RN Assessment(jaundice)  Last BM:  no BM  Height:   Ht Readings from Last 1 Encounters:  08/10/2018 6\' 4"  (1.93 m)    Weight:   Wt Readings from Last 1 Encounters:  08/02/2018 104.3 kg    BMI:  Body mass index is 27.99 kg/m.  Estimated Nutritional Needs:  Kcal:  2320 kcals   Protein:  140-180 g   Fluid:  per MD    Romelle Starcher MS, RD, LDN, CNSC [phone] Pager  [phone] Weekend/On-Call Pager

## 2018-08-03 NOTE — Progress Notes (Signed)
CRITICAL VALUE ALERT  Critical Value:  K > 7.5  Date & Time Notied:  08/01/2018 @ 2037  Provider Notified: Warrick Parisian  Orders Received/Actions taken: New orders

## 2018-08-03 NOTE — Progress Notes (Signed)
Patient ID: Jacob Mullen, male   DOB: May 20, 1966, 53 y.o.   MRN: [id number] [facility] Progress Note   Assessment/ Plan:   1.  Acute kidney injury:  From available data so far, pointing toward HRS type I based on low urine sodium and relatively inactive urine sediment.  No evidence of obstruction/hydronephrosis seen on labs and abdominal exam does not quite indicate intra-abdominal hypertension but might be worth checking bladder pressures to see if he needs paracentesis.  Fluid drawn yesterday to evaluate for SBP.  Unfortunately, if this is HRS, the utility of long-term dialysis is questionable especially with inability to correct underlying condition with transplantation. 2.  Anion gap metabolic acidosis: Secondary to acute kidney injury as well as lactic acidosis from circulatory shock.  Continue CRRT at this time. 3.  Acute metabolic encephalopathy: Suspected multiple etiologies including hepatic encephalopathy and uremia.  Continue CRRT. 4.  Hyponatremia: Secondary to acute kidney injury as well as underlying cirrhosis/chronic alcohol use.    Activated ADH with disparity between urine osmolality/serum osmolality. 5.  Acute respiratory failure: Secondary to altered mental status/inability to protect airway.  Status post intubation. 6.  Acute hepatic injury on underlying cirrhosis: Secondary to alcohol-induced injury and ongoing supportive management per gastroenterology.  Subjective:   Started on CRRT overnight.   Objective:   BP (!) 107/54 (BP Location: Left Arm)   Pulse 73   Temp (!) 97.2 F (36.2 C) (Axillary)   Resp 18   Ht '6\' 4"'$  (1.93 m)   Wt 104.3 kg   SpO2 100%   BMI 27.99 kg/m   Intake/Output Summary (Last 24 hours) at 08/09/2018 6789 Last data filed at 07/14/2018 0800 Gross per 24 hour  Intake 4284.63 ml  Output 1562 ml  Net 2722.63 ml   Weight change:   Physical Exam: Gen: Appears to be comfortable, intubated, icteric CVS: Pulse regular rhythm, normal  rate, S1 and S2 with ejection systolic murmur Resp: Anteriorly clear to auscultation, no rales/rhonchi Abd: Soft, moderate global distention, bowel sounds scant Ext: 2+ lower extremity edema  Imaging: Ct Abdomen Pelvis Wo Contrast  Result Date: 08/08/2018 CLINICAL DATA:  Jaundice, worsening of anorexia and progressive weakness. Abdominal distention and positive alcohol consumption of a 6 pack of beer per day. EXAM: CT ABDOMEN AND PELVIS WITHOUT CONTRAST TECHNIQUE: Multidetector CT imaging of the abdomen and pelvis was performed following the standard protocol without IV contrast. COMPARISON:  None. FINDINGS: Lower chest: Small right and trace left pleural effusions with atelectasis. Top-normal heart size. Gastric tube noted in the distal esophagus extending into the stomach. Hepatobiliary: Cirrhotic appearance of the liver with moderate to large volume of ascites. No space-occupying mass identified on this unenhanced study. No biliary dilatation is seen. The the gallbladder appears contracted and there appears to be a 5 mm calcification likely representing small nonobstructing gallstone. Pancreas: The pancreatic gland is unremarkable. No ductal dilatation or mass. No definite inflammation. Spleen: No splenomegaly or mass. Adrenals/Urinary Tract: Normal bilateral adrenal glands. The unenhanced kidneys are unremarkable. No nephrolithiasis nor obstructive uropathy. The urinary bladder is decompressed by Foley catheter. Stomach/Bowel: Transmural and fold thickening of small bowel loops without mechanical bowel obstruction. Centralized small bowel loops due to ascites. Findings may be secondary to hypoproteinemia from liver disease. Sympathetic thickening is also possibility from the ascites. The colon is somewhat thickened and contracted in appearance more so along the right colon. The stomach is distended with oral contrast and contains a gastric tube with tip in the gastric  antrum. Vascular/Lymphatic:  Nonaneurysmal abdominal aorta. No lymphadenopathy. Small retroperitoneal and mesenteric lymph nodes are present however. Reproductive: Normal size prostate. Other: Moderate to large volume of ascites as stated. Musculoskeletal: No acute nor suspicious osseous lesions. IMPRESSION: 1. Cirrhotic appearance of the liver with moderate to large volume of ascites. 2. Small right and trace left pleural effusions with atelectasis. 3. Transmural and fold thickening of small bowel loops without mechanical bowel obstruction. Findings may be secondary to hypoproteinemia from liver disease. Sympathetic thickening is also possibility from surrounding ascites. Malabsorption can also a similar appearance but is believed less likely given the liver disease. 4. Contracted gallbladder with 5 mm calcification likely representing a nonobstructing gallstone. Electronically Signed   By: Ashley Royalty M.D.   On: 08/09/2018 22:58   US Renal  Result Date: 07/26/2018 CLINICAL DATA:  Acute renal failure EXAM: RENAL / URINARY TRACT ULTRASOUND COMPLETE COMPARISON:  CT 001/17/202019 FINDINGS: Right Kidney: Renal measurements: 13.4 x 5.8 x 5.8 cm = volume: 233.9 mL. Cortical echogenicity normal. No hydronephrosis. Cyst in the upper pole measuring 1.5 cm. Left Kidney: Renal measurements: 13.3 x 5.6 x 5.6 cm = volume: 223.8 mL. Echogenicity within normal limits. No mass or hydronephrosis visualized. Bladder: Foley catheter in the bladder. Incidental note made of moderate ascites and cirrhosis of the liver IMPRESSION: 1. Negative for hydronephrosis.  Single cyst in the right kidney 2. Ascites with cirrhosis of the liver Electronically Signed   By: Donavan Foil M.D.   On: 08/09/2018 01:29   Dg Chest Port 1 View  Result Date: 08/05/2018 CLINICAL DATA:  Follow-up aspiration EXAM: PORTABLE CHEST 1 VIEW COMPARISON:  07/17/2018 FINDINGS: Cardiac shadow is stable in appearance. Endotracheal tube, bilateral jugular catheters and nasogastric catheter are  noted and stable. The lungs are well aerated bilaterally. Mild interstitial changes are seen without focal confluent infiltrate. No bony abnormality is noted. IMPRESSION: Tubes and lines as described above. Mild interstitial changes without focal infiltrate. Electronically Signed   By: Inez Catalina M.D.   On: 07/12/2018 08:02   Dg Chest Port 1 View  Result Date: 07/12/2018 CLINICAL DATA:  Central line placement EXAM: PORTABLE CHEST 1 VIEW COMPARISON:  August 02, 2018 FINDINGS: A new right central line terminates in the SVC just inferior to the brachiocephalic confluence. The left central line is stable. The ETT is in good position. The NG tube terminates below today's study. The cardiomediastinal silhouette is normal. No pneumothorax. No other acute abnormalities. IMPRESSION: Support apparatus as above. No pneumothorax after right line placement. Electronically Signed   By: Dorise Bullion III M.D   On: 07/22/2018 21:38   Dg Chest Port 1 View  Result Date: 07/14/2018 CLINICAL DATA:  Evaluate central line placement EXAM: PORTABLE CHEST 1 VIEW COMPARISON:  August 02, 2018 FINDINGS: The left central line now terminates in the central SVC. The ETT is in good position. The NG tube terminates below today's film. No pneumothorax. No change in the cardiomediastinal silhouette. No other interval changes. IMPRESSION: The left central line is now in good position terminating in the SVC. Other support apparatus as above. No pneumothorax. Electronically Signed   By: Dorise Bullion III M.D   On: 07/13/2018 17:18   Dg Chest Port 1 View  Result Date: 08/04/2018 CLINICAL DATA:  53 year old male status post intubation and central line placement EXAM: PORTABLE CHEST 1 VIEW COMPARISON:  None. FINDINGS: The patient is intubated. The tip of the endotracheal tube is well positioned 3.3 cm above the carina. A gastric  tube is present, the tip of the tube lies inferior to the diaphragm, off the field of view but presumably in  the stomach. A left IJ approach central venous catheter has been placed. The tip of the catheter deflects laterally rather than medially and overlies the scapula, presumably in the axillary vein. No evidence of pneumothorax. Low inspiratory volumes. Minimal bronchitic changes, likely chronic. IMPRESSION: 1. The left IJ approach central venous catheter deflects laterally and overlies the axillary vein. 2. Well-positioned endotracheal tube with the tip 3.3 cm above the carina. 3. The tip of the gastric tube is inferior to the diaphragm, off the field of view but presumably in the stomach. 4. No evidence of pneumothorax, pleural effusion or other acute cardiopulmonary process. 5. Inspiratory volumes are low. These results were called by telephone at the time of interpretation on 07/27/2018 at 4:47 pm to nurse Nira Conn , who verbally acknowledged these results. Electronically Signed   By: Jacqulynn Cadet M.D.   On: 07/14/2018 16:47   Dg Abd Portable 1v  Result Date: 08/05/2018 CLINICAL DATA:  Evaluate OG tube placement EXAM: PORTABLE ABDOMEN - 1 VIEW COMPARISON:  None. FINDINGS: The OG tube distal tip terminates to the right of this film. The tip is likely in the distal stomach or proximal duodenum. IMPRESSION: The distal tip of the OG tube extends beyond this study as above. The distal tip is thought to be in the distal stomach or proximal duodenum. Electronically Signed   By: Dorise Bullion III M.D   On: 07/20/2018 17:19    Labs: BMET Recent Labs  Lab 07/15/2018 1010 07/15/2018 1041 08/09/2018 1749 07/20/2018 1834 07/21/2018 1954 08/07/2018 2239 07/31/2018 0320  NA 122* 122* 124* 120* 121* 127* 129*  K 5.5* 3.9 4.7 >7.5* >7.5* 4.0 3.8  CL 86* 84*  --  85* 85* 86* 88*  CO2 11* 13*  --  16* 16* 14* 17*  GLUCOSE 120* 122*  --  85 84 91 95  BUN 64* 65*  --  70* 71* 75* 65*  CREATININE 4.91* 5.64*  --  6.45* 6.46* 6.84* 5.89*  CALCIUM 8.4* 8.4*  --  7.3* 7.2* 8.2* 8.0*  PHOS  --   --   --  11.3*  --   --  7.4*    CBC Recent Labs  Lab 07/29/2018 1041 07/16/2018 1749 07/28/2018 0320 07/31/2018 0748  WBC 20.1*  --  13.2*  --   NEUTROABS 16.6*  --   --   --   HGB 10.0* 10.2* 7.0* 7.9*  HCT 28.8* 30.0* 19.0* 23.2*  MCV 99.0  --  96.0  --   PLT 85*  --  66*  --     Medications:    . chlorhexidine gluconate (MEDLINE KIT)  15 mL Mouth Rinse BID  . folic acid  1 mg Intravenous Daily  . lactulose  30 g Per Tube TID  . mouth rinse  15 mL Mouth Rinse 10 times per day  . octreotide  50 mcg Intravenous Once  . [START ON 24-Aug-2018] pantoprazole  40 mg Intravenous Q12H  . rifaximin  550 mg Per Tube BID  . thiamine  100 mg Intravenous Daily   Elmarie Shiley, MD 07/28/2018, 8:52 AM

## 2018-08-03 NOTE — Progress Notes (Signed)
[facility] MD notified of ABG results this AM. Increased Precedex and PRN dose of Fentanyl given for vent dyssynchrony. PRN dose of Versed given earlier for biting ETT.  Also MD made aware that OG output looks more coffee ground this AM than the dark green from earlier in the shift with a hemoglobin dropped down to 7 this AM.  New orders received.   Daughter at bedside and aware of current plan of care.

## 2018-08-03 NOTE — Progress Notes (Signed)
CRITICAL VALUE ALERT  Critical Value:  K 7.5  Date & Time Notied:  08/05/2018 @ 1942  Provider Notified: Ogan  Orders Received/Actions taken: Repeat BMET stat

## 2018-08-03 NOTE — Progress Notes (Signed)
eLink Physician-Brief Progress Note Patient Name: Jacob ArnoldMark S Mullen DOB: 12-25-1965 MRN: [id number]   Date of Service  March 25, 2019  HPI/Events of Note  Agitation and clamping down on ETT  eICU Interventions  Versed 1 mg iv Q 1 hour x 2 doses        Okoronkwo U Ogan March 25, 2019, 4:07 AM

## 2018-08-03 NOTE — Progress Notes (Signed)
eLink Physician-Brief Progress Note Patient Name: Jacob ArnoldMark S Mullen DOB: Dec 22, 1965 MRN: [id number]   Date of Service  08/02/2018  HPI/Events of Note  Hemoglobin dropped from 10 to 7, patient with stigmata of upper GI bleeding  eICU Interventions  Transfuse one unit of PRBC, patient is already on PPI-will increase to BID, serial H & H        Okoronkwo U Ogan 07/13/2018, 4:39 AM

## 2018-08-03 NOTE — Progress Notes (Signed)
Hypoglycemic Event  CBG: 66  Treatment: D50 25 mL (12.5 gm)  Symptoms: None  Follow-up CBG: Time: 0127 CBG Result: 127  Possible Reasons for Event: Other: IV insulin for high K  Comments/MD notified: protocol placed    Jacob Mullen, Pearla Dubonnet

## 2018-08-03 NOTE — Progress Notes (Signed)
CRITICAL VALUE ALERT  Critical Value:  Lactic acid 8.8    K+: >7.5 hemolyzed   Date & Time Notied:  07/31/2018 at 0912  Provider Notified: Dr. Nelson Chimes   Orders Received/Actions taken: Repeat potassium lab

## 2018-08-03 NOTE — Progress Notes (Signed)
eLink Physician-Brief Progress Note Patient Name: Jacob Mullen DOB: 08-17-1965 MRN: [id number]   Date of Service  08/11/2018  HPI/Events of Note  Unable to reach Nephrology for CRRT orders despite multiple attempts. Safety zone report filed.  eICU Interventions          Migdalia Dk 08/04/2018, 12:44 AM

## 2018-08-03 NOTE — Progress Notes (Addendum)
eLink Physician-Brief Progress Note Patient Name: KAIS ALBERTA DOB: 1965/10/26 MRN: [id number]   Date of Service  08/07/2018  HPI/Events of Note  Acute renal failure in the context of acute on chronic liver failure. Lactic acid level 10. Possible hepato-renal syndrome vs septic shock with MOSF.  eICU Interventions  25 % Albumin 1 g/kg iv bolus per protocol for hepato-renal syndrome. This should also provide some volume resuscitation for possible septic .     Intervention Category Minor Interventions: Communication with other healthcare providers and/or family  Migdalia Dk 07/21/2018, 12:45 AM

## 2018-08-03 NOTE — Progress Notes (Signed)
CRITICAL VALUE ALERT  Critical Value:  Lactic acid 10.6  Date & Time Notied:  08/11/2018 @ 2315  Provider Notified: Ogan  Orders Received/Actions taken: MD made aware

## 2018-08-03 NOTE — Op Note (Signed)
[facility] Patient Name: Jacob LewandowskyMark Deems Procedure Date : 07/18/2018 MRN: [id number] Attending MD: Jeani HawkingPatrick Tasneem Cormier , MD Date of Birth: 1966-01-11 CSN: [id number] Age: 53 Admit Type: Inpatient Procedure:                Upper GI endoscopy Indications:              Coffee-ground emesis Providers:                Jeani HawkingPatrick Blanch Stang, MD, Zoe LanJenny Kappus, RN, April HoldingJamie Bailey,                            RN, Harrington ChallengerHope Parker, Technician Referring MD:              Medicines:                Fentanyl 25 micrograms IV, Midazolam 2 mg IV Complications:            No immediate complications. Estimated Blood Loss:     Estimated blood loss was minimal. Procedure:                Pre-Anesthesia Assessment:                           - Prior to the procedure, a History and Physical                            was performed, and patient medications and                            allergies were reviewed. The patient's tolerance of                            previous anesthesia was also reviewed. The risks                            and benefits of the procedure and the sedation                            options and risks were discussed with the patient.                            All questions were answered, and informed consent                            was obtained. Prior Anticoagulants: The patient has                            taken no previous anticoagulant or antiplatelet                            agents. ASA Grade Assessment: V - A moribund                            patient who is not expected to survive without the  operation. After reviewing the risks and benefits,                            the patient was deemed in satisfactory condition to                            undergo the procedure.                           - Sedation was administered by an endoscopy nurse.                            The sedation level attained was moderate.                           After obtaining  informed consent, the endoscope was                            passed under direct vision. Throughout the                            procedure, the patient's blood pressure, pulse, and                            oxygen saturations were monitored continuously. The                            [id number]-H190 ([id number]) Olympus gastroscope was                            introduced through the mouth, and advanced to the                            second part of duodenum. The upper GI endoscopy was                            accomplished without difficulty. The patient                            tolerated the procedure well. Scope In: Scope Out: Findings:      LA Grade D (one or more mucosal breaks involving at least 75% of       esophageal circumference) esophagitis with bleeding was found.      Severe portal hypertensive gastropathy was found in the entire examined       stomach.      The examined duodenum was normal.      There was no evidence of any esophageal or fundic varices. The mucosa in       the stomach was friable as noted in the retroflexed image. Impression:               - LA Grade D reflux esophagitis.                           - Portal hypertensive gastropathy.                           -  Normal examined duodenum.                           - No specimens collected. Recommendation:           - Return patient to ICU for ongoing care.                           - Continue with supportive care.                           - Continue with PPI.                           - No need for octreotide. Procedure Code(s):        --- Professional ---                           [phone], Esophagogastroduodenoscopy, flexible,                            transoral; diagnostic, including collection of                            specimen(s) by brushing or washing, when performed                            (separate procedure) Diagnosis Code(s):        --- Professional ---                           K21.0,  Gastro-esophageal reflux disease with                            esophagitis                           K76.6, Portal hypertension                           K31.89, Other diseases of stomach and duodenum                           K92.0, Hematemesis CPT copyright 2018 [employer]. All rights reserved. The codes documented in this report are preliminary and upon coder review may  be revised to meet current compliance requirements. Jeani HawkingPatrick Yuvia Plant, MD Jeani HawkingPatrick Leam Madero, MD February 18, 2019 5:30:16 PM This report has been signed electronically. Number of Addenda: 0

## 2018-08-03 NOTE — Progress Notes (Signed)
CRITICAL VALUE ALERT  Critical Value:  PT: 45.2/INR: 4.95  Date & Time Notied:  07/17/2018 at 0825   Provider Notified: Dr. Nelson Chimes

## 2018-08-04 ENCOUNTER — Encounter (HOSPITAL_COMMUNITY): Payer: Self-pay

## 2018-08-04 LAB — TYPE AND SCREEN
ABO/RH(D): O POS
Antibody Screen: NEGATIVE
UNIT DIVISION: 0
Unit division: 0

## 2018-08-04 LAB — BPAM RBC
BLOOD PRODUCT EXPIRATION DATE: 202002232359
Blood Product Expiration Date: 202002222359
ISSUE DATE / TIME: 202001230504
ISSUE DATE / TIME: 202001231317
Unit Type and Rh: 5100
Unit Type and Rh: 5100

## 2018-08-04 LAB — CBC
HCT: 24.5 % — ABNORMAL LOW (ref 39.0–52.0)
Hemoglobin: 9 g/dL — ABNORMAL LOW (ref 13.0–17.0)
MCH: 35 pg — ABNORMAL HIGH (ref 26.0–34.0)
MCHC: 36.7 g/dL — ABNORMAL HIGH (ref 30.0–36.0)
MCV: 95.3 fL (ref 80.0–100.0)
Platelets: 42 10*3/uL — ABNORMAL LOW (ref 150–400)
RBC: 2.57 MIL/uL — ABNORMAL LOW (ref 4.22–5.81)
RDW: 16.5 % — ABNORMAL HIGH (ref 11.5–15.5)
WBC: 8.3 10*3/uL (ref 4.0–10.5)
nRBC: 0 % (ref 0.0–0.2)

## 2018-08-04 LAB — COMPREHENSIVE METABOLIC PANEL
ALT: 99 U/L — AB (ref 0–44)
ALT: 105 U/L — ABNORMAL HIGH (ref 0–44)
AST: 195 U/L — ABNORMAL HIGH (ref 15–41)
AST: 193 U/L — ABNORMAL HIGH (ref 15–41)
Albumin: 2.5 g/dL — ABNORMAL LOW (ref 3.5–5.0)
Albumin: 2.7 g/dL — ABNORMAL LOW (ref 3.5–5.0)
Alkaline Phosphatase: 91 U/L (ref 38–126)
Alkaline Phosphatase: 84 U/L (ref 38–126)
Anion gap: 10 (ref 5–15)
Anion gap: 11 (ref 5–15)
BUN: 39 mg/dL — ABNORMAL HIGH (ref 6–20)
BUN: 34 mg/dL — ABNORMAL HIGH (ref 6–20)
CALCIUM: 8.1 mg/dL — AB (ref 8.9–10.3)
CALCIUM: 8.4 mg/dL — AB (ref 8.9–10.3)
CO2: 23 mmol/L (ref 22–32)
CO2: 23 mmol/L (ref 22–32)
CREATININE: 3.74 mg/dL — AB (ref 0.61–1.24)
Chloride: 100 mmol/L (ref 98–111)
Chloride: 100 mmol/L (ref 98–111)
Creatinine, Ser: 3.35 mg/dL — ABNORMAL HIGH (ref 0.61–1.24)
GFR calc Af Amer: 20 mL/min — ABNORMAL LOW (ref >60)
GFR calc Af Amer: 23 mL/min — ABNORMAL LOW (ref >60)
GFR calc non Af Amer: 17 mL/min — ABNORMAL LOW (ref >60)
GFR calc non Af Amer: 20 mL/min — ABNORMAL LOW (ref >60)
Glucose, Bld: 105 mg/dL — ABNORMAL HIGH (ref 70–99)
Glucose, Bld: 114 mg/dL — ABNORMAL HIGH (ref 70–99)
Potassium: 4 mmol/L (ref 3.5–5.1)
Potassium: 4.3 mmol/L (ref 3.5–5.1)
Sodium: 133 mmol/L — ABNORMAL LOW (ref 135–145)
Sodium: 134 mmol/L — ABNORMAL LOW (ref 135–145)
Total Bilirubin: 38.6 mg/dL (ref 0.3–1.2)
Total Bilirubin: 20 mg/dL (ref 0.3–1.2)
Total Protein: 5.8 g/dL — ABNORMAL LOW (ref 6.5–8.1)
Total Protein: 6.2 g/dL — ABNORMAL LOW (ref 6.5–8.1)

## 2018-08-04 LAB — PHOSPHORUS: Phosphorus: 5.1 mg/dL — ABNORMAL HIGH (ref 2.5–4.6)

## 2018-08-04 LAB — RENAL FUNCTION PANEL
ANION GAP: 11 (ref 5–15)
Albumin: 2.7 g/dL — ABNORMAL LOW (ref 3.5–5.0)
BUN: 34 mg/dL — ABNORMAL HIGH (ref 6–20)
CHLORIDE: 100 mmol/L (ref 98–111)
CO2: 23 mmol/L (ref 22–32)
Calcium: 8.3 mg/dL — ABNORMAL LOW (ref 8.9–10.3)
Creatinine, Ser: 3.31 mg/dL — ABNORMAL HIGH (ref 0.61–1.24)
GFR calc non Af Amer: 20 mL/min — ABNORMAL LOW (ref >60)
GFR, EST AFRICAN AMERICAN: 23 mL/min — AB (ref >60)
Glucose, Bld: 113 mg/dL — ABNORMAL HIGH (ref 70–99)
Phosphorus: 5.5 mg/dL — ABNORMAL HIGH (ref 2.5–4.6)
Potassium: 4.3 mmol/L (ref 3.5–5.1)
SODIUM: 134 mmol/L — AB (ref 135–145)

## 2018-08-04 LAB — CULTURE, RESPIRATORY W GRAM STAIN: Culture: NORMAL

## 2018-08-04 LAB — PROTIME-INR
INR: 4.95
INR: 4.72
Prothrombin Time: 45.2 seconds — ABNORMAL HIGH (ref 11.4–15.2)
Prothrombin Time: 43.6 seconds — ABNORMAL HIGH (ref 11.4–15.2)

## 2018-08-04 LAB — MAGNESIUM: Magnesium: 2.5 mg/dL — ABNORMAL HIGH (ref 1.7–2.4)

## 2018-08-04 LAB — GLUCOSE, CAPILLARY
Glucose-Capillary: 101 mg/dL — ABNORMAL HIGH (ref 70–99)
Glucose-Capillary: 103 mg/dL — ABNORMAL HIGH (ref 70–99)
Glucose-Capillary: 94 mg/dL (ref 70–99)
Glucose-Capillary: 95 mg/dL (ref 70–99)
Glucose-Capillary: 96 mg/dL (ref 70–99)
Glucose-Capillary: 96 mg/dL (ref 70–99)

## 2018-08-04 LAB — ALBUMIN, FLUID (OTHER): Albumin, Body Fluid Other: 0.2 g/dL

## 2018-08-04 MED ORDER — VITAL 1.5 CAL PO LIQD
1000.0000 mL | ORAL | Status: DC
  Administered 2018-08-04: 1000 mL
  Filled 2018-08-04 (×3): qty 1000

## 2018-08-04 MED ORDER — MIDAZOLAM HCL 2 MG/2ML IJ SOLN
INTRAMUSCULAR | Status: AC
  Administered 2018-08-04: 0.5 mg via INTRAVENOUS
  Filled 2018-08-04: qty 2

## 2018-08-04 MED ORDER — PRO-STAT SUGAR FREE PO LIQD
30.0000 mL | Freq: Every day | ORAL | Status: DC
  Administered 2018-08-04 – 2018-08-06 (×11): 30 mL via ORAL
  Filled 2018-08-04 (×11): qty 30

## 2018-08-04 MED ORDER — MIDAZOLAM HCL 2 MG/2ML IJ SOLN
INTRAMUSCULAR | Status: AC
  Administered 2018-08-04: 1 mg via INTRAVENOUS
  Filled 2018-08-04: qty 2

## 2018-08-04 MED ORDER — ALBUMIN HUMAN 25 % IV SOLN
25.0000 g | Freq: Four times a day (QID) | INTRAVENOUS | Status: AC
  Administered 2018-08-04 – 2018-08-06 (×8): 25 g via INTRAVENOUS
  Filled 2018-08-04 (×3): qty 100
  Filled 2018-08-04: qty 50
  Filled 2018-08-04 (×4): qty 100

## 2018-08-04 MED ORDER — VITAL HIGH PROTEIN PO LIQD: 1000.0000 mL | ORAL | Status: DC

## 2018-08-04 MED ORDER — SODIUM CHLORIDE 0.9 % IV SOLN
50.0000 ug/h | INTRAVENOUS | Status: DC
  Administered 2018-08-04 – 2018-08-06 (×5): 50 ug/h via INTRAVENOUS
  Filled 2018-08-04 (×7): qty 1

## 2018-08-04 MED ORDER — B COMPLEX-C PO TABS
1.0000 | ORAL_TABLET | Freq: Every day | ORAL | Status: DC
  Administered 2018-08-04 – 2018-08-06 (×3): 1
  Filled 2018-08-04 (×4): qty 1

## 2018-08-04 MED ORDER — FENTANYL BOLUS VIA INFUSION
25.0000 ug | INTRAVENOUS | Status: DC | PRN
  Administered 2018-08-05: 25 ug via INTRAVENOUS
  Filled 2018-08-04: qty 25

## 2018-08-04 MED ORDER — MIDAZOLAM HCL 2 MG/2ML IJ SOLN
0.5000 mg | Freq: Once | INTRAMUSCULAR | Status: AC
  Administered 2018-08-04: 0.5 mg via INTRAVENOUS

## 2018-08-04 MED ORDER — MIDAZOLAM HCL 2 MG/2ML IJ SOLN
1.0000 mg | Freq: Once | INTRAMUSCULAR | Status: AC
  Administered 2018-08-04: 1 mg via INTRAVENOUS

## 2018-08-04 MED ORDER — FENTANYL 2500MCG IN NS 250ML (10MCG/ML) PREMIX INFUSION
0.0000 ug/h | INTRAVENOUS | Status: DC
  Administered 2018-08-04: 100 ug/h via INTRAVENOUS
  Administered 2018-08-05: 250 ug/h via INTRAVENOUS
  Administered 2018-08-05: 350 ug/h via INTRAVENOUS
  Administered 2018-08-05: 300 ug/h via INTRAVENOUS
  Administered 2018-08-06: 250 ug/h via INTRAVENOUS
  Administered 2018-08-06: 350 ug/h via INTRAVENOUS
  Filled 2018-08-04 (×6): qty 250

## 2018-08-04 MED ORDER — PANTOPRAZOLE SODIUM 40 MG IV SOLR
40.0000 mg | Freq: Two times a day (BID) | INTRAVENOUS | Status: DC
  Administered 2018-08-04 – 2018-08-06 (×5): 40 mg via INTRAVENOUS
  Filled 2018-08-04 (×5): qty 40

## 2018-08-04 NOTE — Progress Notes (Signed)
NAME:  Jacob Mullen, MRN:  [id number], DOB:  04-Feb-1966, LOS: 2 ADMISSION DATE:  07/19/2018, CONSULTATION DATE:  1/22 REFERRING MD:  Alfonzo Feller, CHIEF COMPLAINT:  Acute metabolic encephaloapthy    Brief History   2 yom w/ sig h/o ETOH, recently identified elevated LFTs and bilirubin w/ [place] raising concern for chronic hepatocellular disease & cirrhosis as well as distended GB w/out gallstones, biliary dilation or wall thickening. Admitted acutely on 1/22 after being found by mother encephalopathic. Lab findings showing: ammonia 324, INR 3.08, new renal failure w/ cr 5.64, rising t-bilirubin from 26.8 to 40.7 and increased lipase. PCCM asked to admit given multiple metabolic derangements and hepato-renal failure   History of present illness    53 year old male patient with known history of  Presented to [facility] initially on 1/8 with chief complaint of jaundice, marked by yellowing of the eyes, darkening urine and worsening appetite for about a 2-week timeframe.  He has had some progressive weakness.  Admitted to drinking 4 to 512 ounce beers daily, and has reported withdrawal type symptoms and has been admitted for withdrawal in the past.  In the ER he was found to have elevated bilirubin and mildly elevated LFTs and ultrasound of the abdomen was checked, it was negative for obstruction he was instructed to abstain from alcohol, and obtain GI outpatient consultation.  He presents to the emergency room on 1/22 at [facility] with chief complaint of altered mental status.  He apparently continues to drink.  He was apparently found by his mother the a.m. of 1/22 agitated and confused, she did find some dark appearing emesis in the house when she went to pick him up.  Diagnostic evaluation in the emergency room demonstrated the following: Lipase elevated from 45 up to 86, ammonia level 324, AST 98, ALT 54, total bilirubin 40.7 with alk phos of 141, sodium of 122, BUN and creatinine acutely elevated up to 5.64 and  65.  Because of his profound metabolic derangements and acute hepatic and metabolic encephalopathy he was transferred to [facility] for further evaluation and sub-specialty care.  Past Medical History  ETOH   Significant Hospital Events   1/22: Admitted with acute encephalopathy felt metabolic and hepatic in nature, acute renal failure, profound lactic acidosis, ineffective airway management, and profound jaundice.  Consults:  GI medicine 1/22  Procedures:  Oral endotracheal tube 1/22 Central line 1/22 HD catheter 1/22 Paracentesis 1/23  Significant Diagnostic Tests:  Abdominal ultrasound 1/8: Heterogenous hepatic and echogenicity suggesting steatosis versus chronic hepatocellular disease.  Mild micronodular contours of the liver suggesting cirrhosis.  There is trace perihepatic ascites.  The gallbladder was distended, irregular in shape but there were no gallstones or wall thickening there was no biliary dilation 1/22: Lipase 86 up from 45 2 weeks prior, ammonia 324, Acetaminophen less than 10, lactic acid 10.7, INR 3.08, alk phos 141 down from 162, total bilirubin 40.7 up from 26.8, serum creatinine 5.64 CT abdomen pelvis 1/22 Micro Data:  Blood cultures times two 1/22 Respiratory culture 1/22>>> Ascites 1/22>>>  Antimicrobials:  Zosyn 1/22  Interim history/subjective:  Patient is becoming more agitated. Not responding to any commands.  Objective   Blood pressure (!) 99/52, pulse (!) 53, temperature (!) 96.2 F (35.7 C), temperature source Axillary, resp. rate 13, height _0  (1.93 m), weight 108.8 kg, SpO2 98 %. CVP:  [7 mmHg-12 mmHg] 12 mmHg  Vent Mode: PRVC FiO2 (%):  [40 %] 40 % Set Rate:  [12 bmp] 12 bmp  Vt Set:  [600 mL] 600 mL PEEP:  [5 cmH20] 5 cmH20 Plateau Pressure:  [12 cmH20] 12 cmH20   Intake/Output Summary (Last 24 hours) at 08/04/2018 1436 Last data filed at 08/04/2018 1412 Gross per 24 hour  Intake 2825.12 ml  Output 4395 ml  Net -1569.88 ml   Filed  Weights   08/01/2018 1002 08/05/2018 0452 08/04/18 0500  Weight: 104.3 kg 104.3 kg 108.8 kg    Examination: General: Jaundiced appearing white male sclera completely icteric, intubated and sedated, is in acute distress HENT: Normocephalic, atraumatic ,scleral  icterous, mucous membranes moist frothy oral secretions Lungs: Clear bilaterally with decreased breath sounds at bases. Cardiovascular: Regular rate and rhythm, no murmurs. Abdomen: Distended, firm, shifting dullness. Extremities: Jaundiced appearing, cool,  Pulses palpable Neuro: Sedated, not following any commands. GU: Foley in place.  Resolved Hospital Problem list     Assessment & Plan:   Acute liver failure, superimposed on underlying cirrhosis.  Suspect secondary to acute alcoholic hepatitis. Elevated T-bili. Acute hepatic and toxic/metabolic encephalopathy    also concern about gallbladder distention however no evidence of obstruction or biliary dilation, 1 nonobstructing gallstone on CT. MELD score 40, hepatitis discriminant function score was 170 today, mild improvement from yesterday. CT abdomen shows liver cirrhosis with moderate ascites and small bilateral pleural effusion more on right.  Contracted gallbladder with 5 mm nonobstructing gallstone. Liver enzymes are not markedly elevated making hepatitis less likely. INR 4.72 today, consistent with liver failure. Pretty much unchanged from yesterday. Plan Serial LFTs Serial INR Treat withdrawal and delirium IV hydration Start him on lactulose and rifaximin . Zosyn for SBP Start trickle feed.  Hematemesis.  Continue to have coffee-ground emesis and OG tube with hemoglobin dropped to 7, it was 12.5 on 1/8. Hemoglobin 9 today after 2 units of packed RBCs. EGD done yesterday shows gastritis and portal hypertensive gastropathy with no variceal bleeding. Octreotide was discontinued yesterday. Switch Protonix GTT with Protonix IV twice daily. -Monitor CBC with  transfusion goal of 7.  Acute respiratory failure 2/2 ineffective airway protection and possibly aspiration event.  Plan Intubate/ventilate  PAD protocol RASS goal -1 VAP bundle  PCXR Sputum culture  Empiric abx for aspiration: zosyn   Elevated lipase. ? Mild pancreatitis Plan  CT abd/pelvis-does not show any pancreatitis.  Acute Circulatory shock, r/o sepsis  Plan Transduce CVP IV hydration for CVP > 10 MAP goal > 65 Pan culture  Empiric abx   Acute renal failure: suspect hepatorenal syndrome but could also be volume depleted.  Plan. Nephrology was consulted- appreciate their recommendations. Patient was placed on CRRT. IV hydration Repeat labs  MAP goal > 70   Severe anion gap metabolic acidosis (lactic acidosis) in setting of shock, w/ renal failure Plan MAP goal > 70 Ck abg Serial chemistries   Fluid and electrolyte imbalance: hyponatremia  Plan Ck serum and urine osmo Ck CVP  Cont gentle NaCl  leukocytosis Plan Trend CBC  Coagulopathy: Suspect hepatic Plan Trend INR FFP  Anemia w/out evidence of bleeding Plan Trend CBC    Best practice:  Diet: NPO Pain/Anxiety/Delirium protocol (if indicated): PAD protocol precedex and fent  VAP protocol (if indicated): 1/22 DVT prophylaxis: SCD GI prophylaxis: PPI Glucose control: na Mobility: BR Code Status: full code  Family Communication: pending  Disposition: critically ill.  With multiple organ failure this includes renal, liver, respiratory, and circulatory shock.  We have asked both GI and nephrology to assist with his care.  Labs   CBC: Recent Labs  Lab 07/13/2018 1041  07/30/2018 0320 07/24/2018 0748 07/23/2018 1721 08/07/2018 2029 08/04/18 0415  WBC 20.1*  --  13.2*  --  9.3  --  8.3  NEUTROABS 16.6*  --   --   --   --   --   --   HGB 10.0*   < > 7.0* 7.9* 9.2* 11.2* 9.0*  HCT 28.8*   < > 19.0* 23.2* 24.6* 33.0* 24.5*  MCV 99.0  --  96.0  --  93.2  --  95.3  PLT 85*  --  66*  --  PLATELET  CLUMPS NOTED ON SMEAR, UNABLE TO ESTIMATE  --  42*   < > = values in this interval not displayed.    Basic Metabolic Panel: Recent Labs  Lab 07/14/2018 1834  07/17/2018 2239 08/05/2018 0320 07/21/2018 0748 07/29/2018 1211 08/09/2018 1721 07/20/2018 2029 08/04/18 0415  NA 120*   < > 127* 129* 124*  --  132*  132* 132* 133*  K >7.5*   < > 4.0 3.8 >7.5* 3.8 3.9  3.9 3.9 4.0  CL 85*   < > 86* 88* 89*  --  95*  95*  --  100  CO2 16*   < > 14* 17* 17*  --  21*  20*  --  23  GLUCOSE 85   < > 91 95 82  --  99  100*  --  105*  BUN 70*   < > 75* 65* 59*  --  47*  46*  --  39*  CREATININE 6.45*   < > 6.84* 5.89* 5.43*  --  4.49*  4.35*  --  3.74*  CALCIUM 7.3*   < > 8.2* 8.0* 7.2*  --  8.2*  8.2*  --  8.1*  MG 2.0  --   --  1.8  --   --   --   --  2.5*  PHOS 11.3*  --   --  7.4*  --   --  5.6*  --  5.1*   < > = values in this interval not displayed.   GFR: Estimated Creatinine Clearance: 30.5 mL/min (A) (by C-G formula based on SCr of 3.74 mg/dL (H)). Recent Labs  Lab 07/16/2018 1041  07/16/2018 1834 07/23/2018 2230 07/23/2018 0320 08/01/2018 0748 07/15/2018 1211 07/12/2018 1721 08/04/18 0415  PROCALCITON  --   --  2.70  --   --   --   --   --   --   WBC 20.1*  --   --   --  13.2*  --   --  9.3 8.3  LATICACIDVEN  --    < > 8.9* 10.6*  --  8.8* 6.5*  --   --    < > = values in this interval not displayed.    Liver Function Tests: Recent Labs  Lab 07/23/2018 1041 07/27/2018 0320 07/15/2018 1721 08/04/18 0415  AST 98* 116* 166* 195*  ALT 54* 60* 81* 99*  ALKPHOS 141* 79 87 91  BILITOT 40.7* 36.5* 38.4* 38.6*  PROT 6.7 6.0* 5.9* 5.8*  ALBUMIN 2.2* 2.9* 2.7*  2.7* 2.5*   Recent Labs  Lab 07/13/2018 1041 08/09/2018 1834 07/28/2018 0320  LIPASE 86*  --  291*  AMYLASE  --  331*  --    Recent Labs  Lab 08/05/2018 1010 07/18/2018 0320  AMMONIA 324* 139*    ABG    Component Value Date/Time   PHART 7.573 (H) 08/11/2018 2029   PCO2ART 27.2 (L)  07/13/2018 2029   PO2ART 229.0 (H) 07/25/2018 2029    HCO3 25.2 08/11/2018 2029   TCO2 26 07/21/2018 2029   ACIDBASEDEF 3.2 (H) 08/01/2018 0900   O2SAT 100.0 07/30/2018 2029     Coagulation Profile: Recent Labs  Lab 08/08/2018 1041 07/20/2018 1834 08/01/2018 0748 08/04/18 0415  INR 3.08 3.43 4.95* 4.72*    Cardiac Enzymes: No results for input(s): CKTOTAL, CKMB, CKMBINDEX, TROPONINI in the last 168 hours.  HbA1C: No results found for: HGBA1C  CBG: Recent Labs  Lab 07/24/2018 1950 08/09/2018 2340 08/04/18 0333 08/04/18 0746 08/04/18 1104  GLUCAP 87 94 95 103* 101*    Review of Systems:   Not able   Past Medical History  He,  has a past medical history of Hypertension and Thyroid disease.   Surgical History   History reviewed. No pertinent surgical history.   Social History   reports that he has been smoking. He has been smoking about 0.50 packs per day. He does not have any smokeless tobacco history on file. He reports current alcohol use.   Family History   His family history is not on file.   Allergies Allergies  Allergen Reactions  . Codeine Itching     Home Medications  Prior to Admission medications   Medication Sig Start Date End Date Taking? Authorizing Provider  mirtazapine (REMERON) 15 MG tablet Take 2 tablets (30 mg total) by mouth at bedtime. 06/14/18   Shugart, Lissa Hoard, PA-C  traZODone (DESYREL) 50 MG tablet Take 2 tablets (100 mg total) by mouth at bedtime. 05/24/18   Comer Locket, PA-C     Critical care time:      Lorella Nimrod MD PGY3 Pager [phone]

## 2018-08-04 NOTE — Progress Notes (Signed)
Subjective: No acute events.  Objective: Vital signs in last 24 hours: Temp:  [96 F (35.6 C)-98.1 F (36.7 C)] 97.8 F (36.6 C) (01/24 0400) Pulse Rate:  [57-77] 57 (01/24 0600) Resp:  [14-21] 15 (01/24 0600) BP: (91-139)/(47-78) 103/58 (01/24 0600) SpO2:  [99 %-100 %] 100 % (01/24 0600) FiO2 (%):  [40 %] 40 % (01/24 0418) Weight:  [108.8 kg] 108.8 kg (01/24 0500) Last BM Date: (PTA)  Intake/Output from previous day: 01/23 0701 - 01/24 0700 In: 2748.7 [I.V.:1891.1; Blood:427.5; NG/GT:180; IV Piggyback:250] Out: 5065 [Urine:56; Emesis/NG output:1675] Intake/Output this shift: Total I/O In: 1008.2 [I.V.:808.2; NG/GT:150; IV Piggyback:50] Out: 2992 [Urine:45; Emesis/NG output:350; Other:1180]  General appearance: intubated Resp: CTA anteriorly Cardio: regular rate and rhythm GI: soft, non-tender; bowel sounds normal; no masses,  no organomegaly  Lab Results: Recent Labs    07/24/2018 0320  08/07/2018 1721 07/29/2018 2029 08/04/18 0415  WBC 13.2*  --  9.3  --  8.3  HGB 7.0*   < > 9.2* 11.2* 9.0*  HCT 19.0*   < > 24.6* 33.0* 24.5*  PLT 66*  --  PLATELET CLUMPS NOTED ON SMEAR, UNABLE TO ESTIMATE  --  42*   < > = values in this interval not displayed.   BMET Recent Labs    07/28/2018 0748  08/01/2018 1721 07/28/2018 2029 08/04/18 0415  NA 124*  --  132*  132* 132* 133*  K >7.5*   < > 3.9  3.9 3.9 4.0  CL 89*  --  95*  95*  --  100  CO2 17*  --  21*  20*  --  23  GLUCOSE 82  --  99  100*  --  105*  BUN 59*  --  47*  46*  --  39*  CREATININE 5.43*  --  4.49*  4.35*  --  3.74*  CALCIUM 7.2*  --  8.2*  8.2*  --  8.1*   < > = values in this interval not displayed.   LFT Recent Labs    07/25/2018 1211  08/04/18 0415  PROT  --    < > 5.8*  ALBUMIN  --    < > 2.5*  AST  --    < > 195*  ALT  --    < > 99*  ALKPHOS  --    < > 91  BILITOT  --    < > 38.6*  BILIDIR 23.2*  --   --    < > = values in this interval not displayed.   PT/INR Recent Labs     08/01/2018 0748 08/04/18 0415  LABPROT 45.2* 43.6*  INR 4.95* 4.72*   Hepatitis Panel No results for input(s): HEPBSAG, HCVAB, HEPAIGM, HEPBIGM in the last 72 hours. C-Diff No results for input(s): CDIFFTOX in the last 72 hours. Fecal Lactopherrin No results for input(s): FECLLACTOFRN in the last 72 hours.  Studies/Results: Ct Abdomen Pelvis Wo Contrast  Result Date: 07/19/2018 CLINICAL DATA:  Jaundice, worsening of anorexia and progressive weakness. Abdominal distention and positive alcohol consumption of a 6 pack of beer per day. EXAM: CT ABDOMEN AND PELVIS WITHOUT CONTRAST TECHNIQUE: Multidetector CT imaging of the abdomen and pelvis was performed following the standard protocol without IV contrast. COMPARISON:  None. FINDINGS: Lower chest: Small right and trace left pleural effusions with atelectasis. Top-normal heart size. Gastric tube noted in the distal esophagus extending into the stomach. Hepatobiliary: Cirrhotic appearance of the liver with moderate to large volume of ascites. No  space-occupying mass identified on this unenhanced study. No biliary dilatation is seen. The the gallbladder appears contracted and there appears to be a 5 mm calcification likely representing small nonobstructing gallstone. Pancreas: The pancreatic gland is unremarkable. No ductal dilatation or mass. No definite inflammation. Spleen: No splenomegaly or mass. Adrenals/Urinary Tract: Normal bilateral adrenal glands. The unenhanced kidneys are unremarkable. No nephrolithiasis nor obstructive uropathy. The urinary bladder is decompressed by Foley catheter. Stomach/Bowel: Transmural and fold thickening of small bowel loops without mechanical bowel obstruction. Centralized small bowel loops due to ascites. Findings may be secondary to hypoproteinemia from liver disease. Sympathetic thickening is also possibility from the ascites. The colon is somewhat thickened and contracted in appearance more so along the right colon.  The stomach is distended with oral contrast and contains a gastric tube with tip in the gastric antrum. Vascular/Lymphatic: Nonaneurysmal abdominal aorta. No lymphadenopathy. Small retroperitoneal and mesenteric lymph nodes are present however. Reproductive: Normal size prostate. Other: Moderate to large volume of ascites as stated. Musculoskeletal: No acute nor suspicious osseous lesions. IMPRESSION: 1. Cirrhotic appearance of the liver with moderate to large volume of ascites. 2. Small right and trace left pleural effusions with atelectasis. 3. Transmural and fold thickening of small bowel loops without mechanical bowel obstruction. Findings may be secondary to hypoproteinemia from liver disease. Sympathetic thickening is also possibility from surrounding ascites. Malabsorption can also a similar appearance but is believed less likely given the liver disease. 4. Contracted gallbladder with 5 mm calcification likely representing a nonobstructing gallstone. Electronically Signed   By: Ashley Royalty M.D.   On: 07/14/2018 22:58   US Renal  Result Date: 08/01/2018 CLINICAL DATA:  Acute renal failure EXAM: RENAL / URINARY TRACT ULTRASOUND COMPLETE COMPARISON:  CT 001/21/202019 FINDINGS: Right Kidney: Renal measurements: 13.4 x 5.8 x 5.8 cm = volume: 233.9 mL. Cortical echogenicity normal. No hydronephrosis. Cyst in the upper pole measuring 1.5 cm. Left Kidney: Renal measurements: 13.3 x 5.6 x 5.6 cm = volume: 223.8 mL. Echogenicity within normal limits. No mass or hydronephrosis visualized. Bladder: Foley catheter in the bladder. Incidental note made of moderate ascites and cirrhosis of the liver IMPRESSION: 1. Negative for hydronephrosis.  Single cyst in the right kidney 2. Ascites with cirrhosis of the liver Electronically Signed   By: Donavan Foil M.D.   On: 07/20/2018 01:29   Dg Chest Port 1 View  Result Date: 07/13/2018 CLINICAL DATA:  Follow-up aspiration EXAM: PORTABLE CHEST 1 VIEW COMPARISON:  07/23/2018  FINDINGS: Cardiac shadow is stable in appearance. Endotracheal tube, bilateral jugular catheters and nasogastric catheter are noted and stable. The lungs are well aerated bilaterally. Mild interstitial changes are seen without focal confluent infiltrate. No bony abnormality is noted. IMPRESSION: Tubes and lines as described above. Mild interstitial changes without focal infiltrate. Electronically Signed   By: Inez Catalina M.D.   On: 07/12/2018 08:02   Dg Chest Port 1 View  Result Date: 07/20/2018 CLINICAL DATA:  Central line placement EXAM: PORTABLE CHEST 1 VIEW COMPARISON:  August 02, 2018 FINDINGS: A new right central line terminates in the SVC just inferior to the brachiocephalic confluence. The left central line is stable. The ETT is in good position. The NG tube terminates below today's study. The cardiomediastinal silhouette is normal. No pneumothorax. No other acute abnormalities. IMPRESSION: Support apparatus as above. No pneumothorax after right line placement. Electronically Signed   By: Dorise Bullion III M.D   On: 07/13/2018 21:38   Dg Chest Port 1 View  Result  Date: 07/15/2018 CLINICAL DATA:  Evaluate central line placement EXAM: PORTABLE CHEST 1 VIEW COMPARISON:  August 02, 2018 FINDINGS: The left central line now terminates in the central SVC. The ETT is in good position. The NG tube terminates below today's film. No pneumothorax. No change in the cardiomediastinal silhouette. No other interval changes. IMPRESSION: The left central line is now in good position terminating in the SVC. Other support apparatus as above. No pneumothorax. Electronically Signed   By: Dorise Bullion III M.D   On: 08/01/2018 17:18   Dg Chest Port 1 View  Result Date: 08/09/2018 CLINICAL DATA:  53 year old male status post intubation and central line placement EXAM: PORTABLE CHEST 1 VIEW COMPARISON:  None. FINDINGS: The patient is intubated. The tip of the endotracheal tube is well positioned 3.3 cm above the  carina. A gastric tube is present, the tip of the tube lies inferior to the diaphragm, off the field of view but presumably in the stomach. A left IJ approach central venous catheter has been placed. The tip of the catheter deflects laterally rather than medially and overlies the scapula, presumably in the axillary vein. No evidence of pneumothorax. Low inspiratory volumes. Minimal bronchitic changes, likely chronic. IMPRESSION: 1. The left IJ approach central venous catheter deflects laterally and overlies the axillary vein. 2. Well-positioned endotracheal tube with the tip 3.3 cm above the carina. 3. The tip of the gastric tube is inferior to the diaphragm, off the field of view but presumably in the stomach. 4. No evidence of pneumothorax, pleural effusion or other acute cardiopulmonary process. 5. Inspiratory volumes are low. These results were called by telephone at the time of interpretation on 07/23/2018 at 4:47 pm to nurse Nira Conn , who verbally acknowledged these results. Electronically Signed   By: Jacqulynn Cadet M.D.   On: 07/24/2018 16:47   Dg Abd Portable 1v  Result Date: 07/16/2018 CLINICAL DATA:  Check gastric catheter placement EXAM: PORTABLE ABDOMEN - 1 VIEW COMPARISON:  None. FINDINGS: Gastric catheter is noted coiled within the stomach with the tip near the pylorus. No obstructive changes are seen. No free air is seen. IMPRESSION: Gastric catheter within the distal stomach. Electronically Signed   By: Inez Catalina M.D.   On: 07/14/2018 17:24   Dg Abd Portable 1v  Result Date: 08/11/2018 CLINICAL DATA:  Evaluate OG tube placement EXAM: PORTABLE ABDOMEN - 1 VIEW COMPARISON:  None. FINDINGS: The OG tube distal tip terminates to the right of this film. The tip is likely in the distal stomach or proximal duodenum. IMPRESSION: The distal tip of the OG tube extends beyond this study as above. The distal tip is thought to be in the distal stomach or proximal duodenum. Electronically Signed   By:  Dorise Bullion III M.D   On: 08/05/2018 17:19    Medications:  Scheduled: . chlorhexidine gluconate (MEDLINE KIT)  15 mL Mouth Rinse BID  . folic acid  1 mg Intravenous Daily  . lactulose  30 g Per Tube TID  . mouth rinse  15 mL Mouth Rinse 10 times per day  . [START ON Aug 30, 2018] pantoprazole  40 mg Intravenous Q12H  . rifaximin  550 mg Per Tube BID  . thiamine  100 mg Intravenous Daily   Continuous: .  prismasol BGK 4/2.5 400 mL/hr at 08/04/18 0355  .  prismasol BGK 4/2.5 200 mL/hr at 08/04/18 0358  . sodium chloride 10 mL/hr at 08/01/2018 1800  . dexmedetomidine (PRECEDEX) IV infusion 0.5 mcg/kg/hr (08/04/18 0600)  .  norepinephrine (LEVOPHED) Adult infusion 12 mcg/min (08/04/18 0115)  . pantoprozole (PROTONIX) infusion 8 mg/hr (08/04/18 0600)  . piperacillin-tazobactam 3.375 g (08/04/18 0620)  . prismasol BGK 4/2.5 2,500 mL/hr at 08/04/18 0511  . vasopressin (PITRESSIN) infusion - *FOR SHOCK* 0.03 Units/min (07/14/2018 1618)    Assessment/Plan: 1) Hepatic failure. 2) SBP. 3) Respiratory arrest. 4) Renal failure. 5) Encephalopathy.   The patient remains in critical condition.  There is no significant change from yesterday's examination.  His INR this AM has not essentially changed, even though his value is mildly decreased today.  I spoke to his daughter and relayed my concerns about his ability to recover from this event, which she acknowledges.  Plan: 1) Continue with supportive care. 2) Avoid correcting the INR unless there is concern for severe bleeding.  LOS: 2 days   Faatimah Spielberg D 08/04/2018, 6:32 AM

## 2018-08-04 NOTE — Plan of Care (Signed)
  Problem: Education: Goal: Knowledge of General Education information will improve Description Including pain rating scale, medication(s)/side effects and non-pharmacologic comfort measures Outcome: Progressing   Problem: Health Behavior/Discharge Planning: Goal: Ability to manage health-related needs will improve Outcome: Progressing   Problem: Clinical Measurements: Goal: Ability to maintain clinical measurements within normal limits will improve Outcome: Progressing Goal: Will remain free from infection Outcome: Progressing Goal: Diagnostic test results will improve Outcome: Progressing Goal: Respiratory complications will improve Outcome: Progressing Goal: Cardiovascular complication will be avoided Outcome: Progressing   Problem: Activity: Goal: Risk for activity intolerance will decrease Outcome: Progressing   Problem: Nutrition: Goal: Adequate nutrition will be maintained Outcome: Progressing   Problem: Coping: Goal: Level of anxiety will decrease Outcome: Progressing   Problem: Elimination: Goal: Will not experience complications related to bowel motility Outcome: Progressing Goal: Will not experience complications related to urinary retention Outcome: Progressing   Problem: Pain Managment: Goal: General experience of comfort will improve Outcome: Progressing   Problem: Safety: Goal: Ability to remain free from injury will improve Outcome: Progressing   Problem: Skin Integrity: Goal: Risk for impaired skin integrity will decrease Outcome: Progressing   Remains intubated and sedated. Remains on CRRT. Will reassess GOC after this weekend.

## 2018-08-04 NOTE — Progress Notes (Signed)
CRITICAL VALUE ALERT  Critical Value:  INR 4.72  Date & Time Notied:  08/04/2018 3875  Provider Notified: Pola Corn  Orders Received/Actions taken: MD notified

## 2018-08-04 NOTE — Progress Notes (Signed)
Nutrition Follow-up  DOCUMENTATION CODES:   Non-severe (moderate) malnutrition in context of chronic illness  INTERVENTION:   Tube Feeding:  Begin Vital 1.5 @ 20 ml/hr Goal rate: Vital 1.5 @ 50 ml/hr Add Pro-Stat 30 mL 5 times daily  Add B complex with Vitamin C  NUTRITION DIAGNOSIS:   Moderate Malnutrition related to chronic illness(cirrhosis, EtOH abuse) as evidenced by mild fat depletion, moderate muscle depletion, edema.  Being addressed via TF   GOAL:   Patient will meet greater than or equal to 90% of their needs  Progressing  MONITOR:   Vent status, Labs, Weight trends  REASON FOR ASSESSMENT:   Ventilator    ASSESSMENT:   53 yo male admitted with acute encephalopathy with acute liver failure superimposed on underlying cirrhosis with severe jaundice with possible hepatorenal syndrome, AKI requiring initiation of CRRT, acute respiratory failure 2/2 ineffective airway protection and possible aspiration event requiring intubation. PMH includes h/o EtOH abuse, HTN, thyroid disease  1/22 Admit, Intubated 1/23 CRRT initiated, Paracentesis with 1.5 L removed 1/23 EGD: LA Grade D esophagitis with bleeding, severe portal hyerptensive gastropathy  Pt remains on CRRT, peritoneal fluid consistent with SBP Patient is currently intubated on ventilator support, precedex for sedation, levophed and vasopressin for pressor  MV: 11.1 L/min Temp (24hrs), Avg:97.3 F (36.3 C), Min:96.2 F (35.7 C), Max:98.1 F (36.7 C)  OG tube in place, Consult received to initiate trickle TF  Weight up today, current wt 108.8 kg. 104.3 kg on admission. Net + <1L per I/O flow sheet  Labs: sodium 133, potassium wdl, phosphorus 5.1, ionized calcium 1.01 Meds: lactulose, folic acid, xifaxan  Diet Order:   Diet Order            Diet NPO time specified  Diet effective now              EDUCATION NEEDS:   Not appropriate for education at this time  Skin:  Skin Assessment: Reviewed  RN Assessment(jaundice)  Last BM:  no BM  Height:   Ht Readings from Last 1 Encounters:  07/15/2018 6\' 4"  (1.93 m)    Weight:   Wt Readings from Last 1 Encounters:  08/04/18 108.8 kg    BMI:  Body mass index is 29.2 kg/m.  Estimated Nutritional Needs:   Kcal:  2320 kcals   Protein:  140-180 g   Fluid:  per MD   Romelle Starcher MS, RD, LDN, CNSC [phone] Pager  [phone] Weekend/On-Call Pager

## 2018-08-04 NOTE — Progress Notes (Signed)
Pt bradycardic down to 46 on monitor.Stopped precedex with no improvement in HR. Called [facility] and was asked to monitor for the time being.

## 2018-08-04 NOTE — Progress Notes (Signed)
Patient ID: Jacob Mullen, male   DOB: 09-09-1965, 53 y.o.   MRN: [id number] [facility] Progress Note   Assessment/ Plan:   1.  Acute kidney injury:  Initially suspected HRS 1 associated with FHF in patient with recent diagnosis of cirrhosis.  The presence of SBP however clinically excludes diagnosis of HRS but he likely has significant hepatorenal physiology with splanchnic vasodilatation and renal hypoperfusion.  We will continue CRRT at this time is supportive therapy for the next 24-48 hours with expectations transition to comfort care if we do not have any meaningful recovery.  Daughter updated at bedside. 2.  Anion gap metabolic acidosis: Secondary to acute kidney injury as well as lactic acidosis from circulatory shock.  Continue CRRT at this time. 3.  Acute metabolic encephalopathy: Suspected multiple etiologies including hepatic encephalopathy and uremia.  Continue CRRT. 4.  Hyponatremia: Secondary to acute kidney injury as well as underlying cirrhosis/chronic alcohol use.    Activated ADH with disparity between urine osmolality/serum osmolality. 5.  Acute respiratory failure: Secondary to altered mental status/inability to protect airway.  Status post intubation. 6.    FHF in patient with recently diagnosed cirrhosis: Secondary to alcohol-induced injury and ongoing supportive management per gastroenterology.  Subjective:   Without acute events overnight, remains on pressors.   Objective:   BP (!) 111/56   Pulse (!) 58   Temp (!) 97.5 F (36.4 C) (Axillary)   Resp 12   Ht _0  (1.93 m)   Wt 108.8 kg   SpO2 100%   BMI 29.20 kg/m   Intake/Output Summary (Last 24 hours) at 08/04/2018 0831 Last data filed at 08/04/2018 0626 Gross per 24 hour  Intake 2844.93 ml  Output 5196 ml  Net -2351.07 ml   Weight change: 4.473 kg  Physical Exam: Gen: Appears to be comfortable, intubated, icteric CVS: Pulse regular rhythm, normal rate, S1 and S2 with ejection systolic  murmur Resp: Anteriorly clear to auscultation, no rales/rhonchi Abd: Soft, moderate global distention, bowel sounds scant Ext: 2+ lower extremity edema  Imaging: Ct Abdomen Pelvis Wo Contrast  Result Date: 08/08/2018 CLINICAL DATA:  Jaundice, worsening of anorexia and progressive weakness. Abdominal distention and positive alcohol consumption of a 6 pack of beer per day. EXAM: CT ABDOMEN AND PELVIS WITHOUT CONTRAST TECHNIQUE: Multidetector CT imaging of the abdomen and pelvis was performed following the standard protocol without IV contrast. COMPARISON:  None. FINDINGS: Lower chest: Small right and trace left pleural effusions with atelectasis. Top-normal heart size. Gastric tube noted in the distal esophagus extending into the stomach. Hepatobiliary: Cirrhotic appearance of the liver with moderate to large volume of ascites. No space-occupying mass identified on this unenhanced study. No biliary dilatation is seen. The the gallbladder appears contracted and there appears to be a 5 mm calcification likely representing small nonobstructing gallstone. Pancreas: The pancreatic gland is unremarkable. No ductal dilatation or mass. No definite inflammation. Spleen: No splenomegaly or mass. Adrenals/Urinary Tract: Normal bilateral adrenal glands. The unenhanced kidneys are unremarkable. No nephrolithiasis nor obstructive uropathy. The urinary bladder is decompressed by Foley catheter. Stomach/Bowel: Transmural and fold thickening of small bowel loops without mechanical bowel obstruction. Centralized small bowel loops due to ascites. Findings may be secondary to hypoproteinemia from liver disease. Sympathetic thickening is also possibility from the ascites. The colon is somewhat thickened and contracted in appearance more so along the right colon. The stomach is distended with oral contrast and contains a gastric tube with tip in the gastric antrum. Vascular/Lymphatic: Nonaneurysmal  abdominal aorta. No  lymphadenopathy. Small retroperitoneal and mesenteric lymph nodes are present however. Reproductive: Normal size prostate. Other: Moderate to large volume of ascites as stated. Musculoskeletal: No acute nor suspicious osseous lesions. IMPRESSION: 1. Cirrhotic appearance of the liver with moderate to large volume of ascites. 2. Small right and trace left pleural effusions with atelectasis. 3. Transmural and fold thickening of small bowel loops without mechanical bowel obstruction. Findings may be secondary to hypoproteinemia from liver disease. Sympathetic thickening is also possibility from surrounding ascites. Malabsorption can also a similar appearance but is believed less likely given the liver disease. 4. Contracted gallbladder with 5 mm calcification likely representing a nonobstructing gallstone. Electronically Signed   By: Ashley Royalty M.D.   On: 08/07/2018 22:58   US Renal  Result Date: 07/24/2018 CLINICAL DATA:  Acute renal failure EXAM: RENAL / URINARY TRACT ULTRASOUND COMPLETE COMPARISON:  CT 001/28/202019 FINDINGS: Right Kidney: Renal measurements: 13.4 x 5.8 x 5.8 cm = volume: 233.9 mL. Cortical echogenicity normal. No hydronephrosis. Cyst in the upper pole measuring 1.5 cm. Left Kidney: Renal measurements: 13.3 x 5.6 x 5.6 cm = volume: 223.8 mL. Echogenicity within normal limits. No mass or hydronephrosis visualized. Bladder: Foley catheter in the bladder. Incidental note made of moderate ascites and cirrhosis of the liver IMPRESSION: 1. Negative for hydronephrosis.  Single cyst in the right kidney 2. Ascites with cirrhosis of the liver Electronically Signed   By: Donavan Foil M.D.   On: 08/01/2018 01:29   Dg Chest Port 1 View  Result Date: 07/24/2018 CLINICAL DATA:  Follow-up aspiration EXAM: PORTABLE CHEST 1 VIEW COMPARISON:  07/24/2018 FINDINGS: Cardiac shadow is stable in appearance. Endotracheal tube, bilateral jugular catheters and nasogastric catheter are noted and stable. The lungs are  well aerated bilaterally. Mild interstitial changes are seen without focal confluent infiltrate. No bony abnormality is noted. IMPRESSION: Tubes and lines as described above. Mild interstitial changes without focal infiltrate. Electronically Signed   By: Inez Catalina M.D.   On: 07/29/2018 08:02   Dg Chest Port 1 View  Result Date: 08/01/2018 CLINICAL DATA:  Central line placement EXAM: PORTABLE CHEST 1 VIEW COMPARISON:  August 02, 2018 FINDINGS: A new right central line terminates in the SVC just inferior to the brachiocephalic confluence. The left central line is stable. The ETT is in good position. The NG tube terminates below today's study. The cardiomediastinal silhouette is normal. No pneumothorax. No other acute abnormalities. IMPRESSION: Support apparatus as above. No pneumothorax after right line placement. Electronically Signed   By: Dorise Bullion III M.D   On: 07/19/2018 21:38   Dg Chest Port 1 View  Result Date: 07/31/2018 CLINICAL DATA:  Evaluate central line placement EXAM: PORTABLE CHEST 1 VIEW COMPARISON:  August 02, 2018 FINDINGS: The left central line now terminates in the central SVC. The ETT is in good position. The NG tube terminates below today's film. No pneumothorax. No change in the cardiomediastinal silhouette. No other interval changes. IMPRESSION: The left central line is now in good position terminating in the SVC. Other support apparatus as above. No pneumothorax. Electronically Signed   By: Dorise Bullion III M.D   On: 07/17/2018 17:18   Dg Chest Port 1 View  Result Date: 07/12/2018 CLINICAL DATA:  53 year old male status post intubation and central line placement EXAM: PORTABLE CHEST 1 VIEW COMPARISON:  None. FINDINGS: The patient is intubated. The tip of the endotracheal tube is well positioned 3.3 cm above the carina. A gastric tube is present,  the tip of the tube lies inferior to the diaphragm, off the field of view but presumably in the stomach. A left IJ approach  central venous catheter has been placed. The tip of the catheter deflects laterally rather than medially and overlies the scapula, presumably in the axillary vein. No evidence of pneumothorax. Low inspiratory volumes. Minimal bronchitic changes, likely chronic. IMPRESSION: 1. The left IJ approach central venous catheter deflects laterally and overlies the axillary vein. 2. Well-positioned endotracheal tube with the tip 3.3 cm above the carina. 3. The tip of the gastric tube is inferior to the diaphragm, off the field of view but presumably in the stomach. 4. No evidence of pneumothorax, pleural effusion or other acute cardiopulmonary process. 5. Inspiratory volumes are low. These results were called by telephone at the time of interpretation on 08/01/2018 at 4:47 pm to nurse Nira Conn , who verbally acknowledged these results. Electronically Signed   By: Jacqulynn Cadet M.D.   On: 07/13/2018 16:47   Dg Abd Portable 1v  Result Date: 07/17/2018 CLINICAL DATA:  Check gastric catheter placement EXAM: PORTABLE ABDOMEN - 1 VIEW COMPARISON:  None. FINDINGS: Gastric catheter is noted coiled within the stomach with the tip near the pylorus. No obstructive changes are seen. No free air is seen. IMPRESSION: Gastric catheter within the distal stomach. Electronically Signed   By: Inez Catalina M.D.   On: 07/15/2018 17:24   Dg Abd Portable 1v  Result Date: 08/01/2018 CLINICAL DATA:  Evaluate OG tube placement EXAM: PORTABLE ABDOMEN - 1 VIEW COMPARISON:  None. FINDINGS: The OG tube distal tip terminates to the right of this film. The tip is likely in the distal stomach or proximal duodenum. IMPRESSION: The distal tip of the OG tube extends beyond this study as above. The distal tip is thought to be in the distal stomach or proximal duodenum. Electronically Signed   By: Dorise Bullion III M.D   On: 07/24/2018 17:19    Labs: BMET Recent Labs  Lab 07/20/2018 1834 07/17/2018 1954 08/05/2018 2239 08/09/2018 0320 07/14/2018 2025  07/24/2018 1211 07/30/2018 1721 08/08/2018 2029 08/04/18 0415  NA 120* 121* 127* 129* 124*  --  132*  132* 132* 133*  K >7.5* >7.5* 4.0 3.8 >7.5* 3.8 3.9  3.9 3.9 4.0  CL 85* 85* 86* 88* 89*  --  95*  95*  --  100  CO2 16* 16* 14* 17* 17*  --  21*  20*  --  23  GLUCOSE 85 84 91 95 82  --  99  100*  --  105*  BUN 70* 71* 75* 65* 59*  --  47*  46*  --  39*  CREATININE 6.45* 6.46* 6.84* 5.89* 5.43*  --  4.49*  4.35*  --  3.74*  CALCIUM 7.3* 7.2* 8.2* 8.0* 7.2*  --  8.2*  8.2*  --  8.1*  PHOS 11.3*  --   --  7.4*  --   --  5.6*  --  5.1*   CBC Recent Labs  Lab 08/01/2018 1041  07/29/2018 0320 07/22/2018 0748 07/12/2018 1721 07/14/2018 2029 08/04/18 0415  WBC 20.1*  --  13.2*  --  9.3  --  8.3  NEUTROABS 16.6*  --   --   --   --   --   --   HGB 10.0*   < > 7.0* 7.9* 9.2* 11.2* 9.0*  HCT 28.8*   < > 19.0* 23.2* 24.6* 33.0* 24.5*  MCV 99.0  --  96.0  --  93.2  --  95.3  PLT 85*  --  66*  --  PLATELET CLUMPS NOTED ON SMEAR, UNABLE TO ESTIMATE  --  42*   < > = values in this interval not displayed.    Medications:    . chlorhexidine gluconate (MEDLINE KIT)  15 mL Mouth Rinse BID  . folic acid  1 mg Intravenous Daily  . lactulose  30 g Per Tube TID  . mouth rinse  15 mL Mouth Rinse 10 times per day  . [START ON 08-11-18] pantoprazole  40 mg Intravenous Q12H  . rifaximin  550 mg Per Tube BID  . thiamine  100 mg Intravenous Daily   Elmarie Shiley, MD 08/04/2018, 8:31 AM

## 2018-08-05 LAB — RENAL FUNCTION PANEL
Albumin: 3.5 g/dL (ref 3.5–5.0)
Anion gap: 12 (ref 5–15)
BUN: 26 mg/dL — ABNORMAL HIGH (ref 6–20)
CO2: 25 mmol/L (ref 22–32)
Calcium: 8.9 mg/dL (ref 8.9–10.3)
Chloride: 100 mmol/L (ref 98–111)
Creatinine, Ser: 2.53 mg/dL — ABNORMAL HIGH (ref 0.61–1.24)
GFR calc Af Amer: 32 mL/min — ABNORMAL LOW (ref >60)
GFR calc non Af Amer: 28 mL/min — ABNORMAL LOW (ref >60)
Glucose, Bld: 105 mg/dL — ABNORMAL HIGH (ref 70–99)
Phosphorus: 4.9 mg/dL — ABNORMAL HIGH (ref 2.5–4.6)
Potassium: 4.2 mmol/L (ref 3.5–5.1)
Sodium: 137 mmol/L (ref 135–145)

## 2018-08-05 LAB — GLUCOSE, CAPILLARY
GLUCOSE-CAPILLARY: 102 mg/dL — AB (ref 70–99)
Glucose-Capillary: 122 mg/dL — ABNORMAL HIGH (ref 70–99)
Glucose-Capillary: 122 mg/dL — ABNORMAL HIGH (ref 70–99)
Glucose-Capillary: 91 mg/dL (ref 70–99)
Glucose-Capillary: 91 mg/dL (ref 70–99)
Glucose-Capillary: 107 mg/dL — ABNORMAL HIGH (ref 70–99)

## 2018-08-05 LAB — CBC
HCT: 25 % — ABNORMAL LOW (ref 39.0–52.0)
Hemoglobin: 8.6 g/dL — ABNORMAL LOW (ref 13.0–17.0)
MCH: 34 pg (ref 26.0–34.0)
MCHC: 34.4 g/dL (ref 30.0–36.0)
MCV: 98.8 fL (ref 80.0–100.0)
Platelets: 45 10*3/uL — ABNORMAL LOW (ref 150–400)
RBC: 2.53 MIL/uL — ABNORMAL LOW (ref 4.22–5.81)
RDW: 16.4 % — ABNORMAL HIGH (ref 11.5–15.5)
WBC: 10.9 10*3/uL — AB (ref 4.0–10.5)
nRBC: 0 % (ref 0.0–0.2)

## 2018-08-05 LAB — COMPREHENSIVE METABOLIC PANEL
ALT: 107 U/L — ABNORMAL HIGH (ref 0–44)
AST: 186 U/L — ABNORMAL HIGH (ref 15–41)
Albumin: 3.2 g/dL — ABNORMAL LOW (ref 3.5–5.0)
Alkaline Phosphatase: 83 U/L (ref 38–126)
Anion gap: 11 (ref 5–15)
BILIRUBIN TOTAL: 41.8 mg/dL — AB (ref 0.3–1.2)
BUN: 30 mg/dL — ABNORMAL HIGH (ref 6–20)
CO2: 25 mmol/L (ref 22–32)
Calcium: 8.5 mg/dL — ABNORMAL LOW (ref 8.9–10.3)
Chloride: 101 mmol/L (ref 98–111)
Creatinine, Ser: 2.59 mg/dL — ABNORMAL HIGH (ref 0.61–1.24)
GFR, EST AFRICAN AMERICAN: 31 mL/min — AB (ref >60)
GFR, EST NON AFRICAN AMERICAN: 27 mL/min — AB (ref >60)
Glucose, Bld: 137 mg/dL — ABNORMAL HIGH (ref 70–99)
Potassium: 4 mmol/L (ref 3.5–5.1)
Sodium: 137 mmol/L (ref 135–145)
TOTAL PROTEIN: 6.5 g/dL (ref 6.5–8.1)

## 2018-08-05 LAB — TOTAL BILIRUBIN, BODY FLUID: Total bilirubin, fluid: 2.2 mg/dL

## 2018-08-05 LAB — PROTIME-INR
INR: 4.92
Prothrombin Time: 45.1 seconds — ABNORMAL HIGH (ref 11.4–15.2)

## 2018-08-05 LAB — MAGNESIUM: Magnesium: 2.9 mg/dL — ABNORMAL HIGH (ref 1.7–2.4)

## 2018-08-05 MED ORDER — MIDAZOLAM HCL 2 MG/2ML IJ SOLN
2.0000 mg | INTRAMUSCULAR | Status: DC | PRN
  Administered 2018-08-05 – 2018-08-06 (×2): 2 mg via INTRAVENOUS
  Filled 2018-08-05 (×2): qty 2

## 2018-08-05 MED ORDER — MIDAZOLAM HCL 2 MG/2ML IJ SOLN
2.0000 mg | Freq: Once | INTRAMUSCULAR | Status: AC
  Administered 2018-08-05: 2 mg via INTRAVENOUS
  Filled 2018-08-05: qty 2

## 2018-08-05 MED ORDER — MIDAZOLAM HCL 2 MG/2ML IJ SOLN
INTRAMUSCULAR | Status: AC
  Filled 2018-08-05: qty 2

## 2018-08-05 NOTE — Progress Notes (Signed)
CRITICAL VALUE ALERT  Critical Value:  INR 4.92  Date & Time Notied:  08/05/2018 0428  Provider Notified: Pola Corn  Orders Received/Actions taken: no new orders at this time

## 2018-08-05 NOTE — Progress Notes (Signed)
NAME:  Jacob Mullen, MRN:  [id number], DOB:  [birth year], LOS: 3 ADMISSION DATE:  08/04/2018, CONSULTATION DATE:  1/22 REFERRING MD:  Alfonzo Feller, CHIEF COMPLAINT:  Acute metabolic encephaloapthy    Brief History   41 yom w/ sig h/o ETOH, recently identified elevated LFTs and bilirubin w/ [place] raising concern for chronic hepatocellular disease & cirrhosis as well as distended GB w/out gallstones, biliary dilation or wall thickening. Admitted acutely on 1/22 after being found by mother encephalopathic. Lab findings showing: ammonia 324, INR 3.08, new renal failure w/ cr 5.64, rising t-bilirubin from 26.8 to 40.7 and increased lipase. PCCM asked to admit given multiple metabolic derangements and hepato-renal failure   History of present illness    53 year old male patient with known history of  Presented to [facility] initially on 1/8 with chief complaint of jaundice, marked by yellowing of the eyes, darkening urine and worsening appetite for about a 2-week timeframe.  He has had some progressive weakness.  Admitted to drinking 4 to 512 ounce beers daily, and has reported withdrawal type symptoms and has been admitted for withdrawal in the past.  In the ER he was found to have elevated bilirubin and mildly elevated LFTs and ultrasound of the abdomen was checked, it was negative for obstruction he was instructed to abstain from alcohol, and obtain GI outpatient consultation.  He presents to the emergency room on 1/22 at [facility] with chief complaint of altered mental status.  He apparently continues to drink.  He was apparently found by his mother the a.m. of 1/22 agitated and confused, she did find some dark appearing emesis in the house when she went to pick him up.  Diagnostic evaluation in the emergency room demonstrated the following: Lipase elevated from 45 up to 86, ammonia level 324, AST 98, ALT 54, total bilirubin 40.7 with alk phos of 141, sodium of 122, BUN and creatinine acutely elevated up to 5.64 and  65.  Because of his profound metabolic derangements and acute hepatic and metabolic encephalopathy he was transferred to [facility] for further evaluation and sub-specialty care.  Past Medical History  ETOH   Significant Hospital Events   1/22: Admitted with acute encephalopathy felt metabolic and hepatic in nature, acute renal failure, profound lactic acidosis, ineffective airway management, and profound jaundice.  Consults:  GI medicine 1/22  Procedures:  Oral endotracheal tube 1/22 Central line 1/22 HD catheter 1/22 Paracentesis 1/23  Significant Diagnostic Tests:  Abdominal ultrasound 1/8: Heterogenous hepatic and echogenicity suggesting steatosis versus chronic hepatocellular disease.  Mild micronodular contours of the liver suggesting cirrhosis.  There is trace perihepatic ascites.  The gallbladder was distended, irregular in shape but there were no gallstones or wall thickening there was no biliary dilation 1/22: Lipase 86 up from 45 2 weeks prior, ammonia 324, Acetaminophen less than 10, lactic acid 10.7, INR 3.08, alk phos 141 down from 162, total bilirubin 40.7 up from 26.8, serum creatinine 5.64 CT abdomen pelvis 1/22 Micro Data:  Blood cultures times two 1/22 Respiratory culture 1/22>>> Ascites 1/22>>>  Antimicrobials:  Zosyn 1/22  Interim history/subjective:  Remains on CRRT, vent.  Objective   Blood pressure (!) 108/56, pulse (!) 56, temperature 97.8 F (36.6 C), temperature source Axillary, resp. rate 12, height _0  (1.93 m), weight 107.4 kg, SpO2 98 %. CVP:  [11 mmHg-17 mmHg] 17 mmHg  Vent Mode: PRVC FiO2 (%):  [40 %] 40 % Set Rate:  [12 bmp] 12 bmp Vt Set:  [600 mL] 600 mL  PEEP:  [5 cmH20] 5 cmH20 Plateau Pressure:  [13 cmH20-18 cmH20] 15 cmH20   Intake/Output Summary (Last 24 hours) at 08/05/2018 1247 Last data filed at 08/05/2018 1200 Gross per 24 hour  Intake 3546.99 ml  Output 4925 ml  Net -1378.01 ml   Filed Weights   07/16/2018 0452 08/04/18 0500  08/05/18 0500  Weight: 104.3 kg 108.8 kg 107.4 kg    Examination:  General - jaundice Eyes - pupils dilated ENT - ETT in place Cardiac - regular rate/rhythm, no murmur Chest - b/l rhonchi Abdomen - soft, non tender, decreased BS Extremities - 1+ edema Skin - no rashes Neuro - RASS -3     Resolved Hospital Problem list     Assessment & Plan:   Acute respiratory failure with compromised airway. Plan - full vent support - f/u CXR  Acute liver failure in setting for ETOH with cirrhosis. Plan - d/w family that nothing else to offer for this  Hematemesis with gastritis and portal HTN. Plan - f/u CBC  Aspiration pneumonia. Plan - zosyn  Hepatorenal syndrome. Plan - CRRT  Goals of care. Plan - very poor prognosis - family meeting scheduled for 2 pm on 08-15-18   Best practice:  Diet: NPO DVT prophylaxis: SCD GI prophylaxis: PPI Mobility: BR Code Status: full code  Family Communication: updated family at bedside   Labs   CBC: Recent Labs  Lab 07/22/2018 1041  07/15/2018 0320 07/31/2018 0748 07/24/2018 1721 07/23/2018 2029 08/04/18 0415 08/05/18 0345  WBC 20.1*  --  13.2*  --  9.3  --  8.3 10.9*  NEUTROABS 16.6*  --   --   --   --   --   --   --   HGB 10.0*   < > 7.0* 7.9* 9.2* 11.2* 9.0* 8.6*  HCT 28.8*   < > 19.0* 23.2* 24.6* 33.0* 24.5* 25.0*  MCV 99.0  --  96.0  --  93.2  --  95.3 98.8  PLT 85*  --  66*  --  PLATELET CLUMPS NOTED ON SMEAR, UNABLE TO ESTIMATE  --  42* 45*   < > = values in this interval not displayed.    Basic Metabolic Panel: Recent Labs  Lab 08/08/2018 1834  07/18/2018 0320 07/31/2018 0748  07/24/2018 1721 07/29/2018 2029 08/04/18 0415 08/04/18 1511 08/05/18 0345  NA 120*   < > 129* 124*  --  132*  132* 132* 133* 134*  134* 137  K >7.5*   < > 3.8 >7.5*   < > 3.9  3.9 3.9 4.0 4.3  4.3 4.0  CL 85*   < > 88* 89*  --  95*  95*  --  100 100  100 101  CO2 16*   < > 17* 17*  --  21*  20*  --  _0 GLUCOSE 85   < > 95 82   --  99  100*  --  105* 114*  113* 137*  BUN 70*   < > 65* 59*  --  47*  46*  --  39* 34*  34* 30*  CREATININE 6.45*   < > 5.89* 5.43*  --  4.49*  4.35*  --  3.74* 3.35*  3.31* 2.59*  CALCIUM 7.3*   < > 8.0* 7.2*  --  8.2*  8.2*  --  8.1* 8.4*  8.3* 8.5*  MG 2.0  --  1.8  --   --   --   --  2.5*  --  2.9*  PHOS 11.3*  --  7.4*  --   --  5.6*  --  5.1* 5.5*  --    < > = values in this interval not displayed.   GFR: Estimated Creatinine Clearance: 43.8 mL/min (A) (by C-G formula based on SCr of 2.59 mg/dL (H)). Recent Labs  Lab 07/30/2018 1834 07/20/2018 2230 07/18/2018 0320 07/25/2018 0748 07/21/2018 1211 07/24/2018 1721 08/04/18 0415 08/05/18 0345  PROCALCITON 2.70  --   --   --   --   --   --   --   WBC  --   --  13.2*  --   --  9.3 8.3 10.9*  LATICACIDVEN 8.9* 10.6*  --  8.8* 6.5*  --   --   --     Liver Function Tests: Recent Labs  Lab 08/08/2018 0320 08/08/2018 1721 08/04/18 0415 08/04/18 1511 08/05/18 0345  AST 116* 166* 195* 193* 186*  ALT 60* 81* 99* 105* 107*  ALKPHOS 79 87 91 84 83  BILITOT 36.5* 38.4* 38.6* 20.0* 41.8*  PROT 6.0* 5.9* 5.8* 6.2* 6.5  ALBUMIN 2.9* 2.7*  2.7* 2.5* 2.7*  2.7* 3.2*   Recent Labs  Lab 08/11/2018 1041 07/26/2018 1834 08/09/2018 0320  LIPASE 86*  --  291*  AMYLASE  --  331*  --    Recent Labs  Lab 08/10/2018 1010 08/01/2018 0320  AMMONIA 324* 139*    ABG    Component Value Date/Time   PHART 7.573 (H) 07/28/2018 2029   PCO2ART 27.2 (L) 08/04/2018 2029   PO2ART 229.0 (H) 08/04/2018 2029   HCO3 25.2 08/01/2018 2029   TCO2 26 08/11/2018 2029   ACIDBASEDEF 3.2 (H) 07/25/2018 0900   O2SAT 100.0 07/14/2018 2029     Coagulation Profile: Recent Labs  Lab 07/23/2018 1041 07/17/2018 1834 07/15/2018 0748 08/04/18 0415 08/05/18 0345  INR 3.08 3.43 4.95* 4.72* 4.92*    Cardiac Enzymes: No results for input(s): CKTOTAL, CKMB, CKMBINDEX, TROPONINI in the last 168 hours.  HbA1C: No results found for: HGBA1C  CBG: Recent Labs  Lab  08/04/18 1958 08/04/18 2354 08/05/18 0354 08/05/18 0827 08/05/18 1239  GLUCAP 96 122* 122* 91 102*   CC time 32 minutes  Chesley Mires, MD [place] 08/05/2018, 12:53 PM

## 2018-08-05 NOTE — Progress Notes (Signed)
Patient ID: Jacob Mullen, male   DOB: [birth year], 53 y.o.   MRN: [id number] [facility] Progress Note   Assessment/ Plan:   1.  Acute kidney injury:  Likely with hepatorenal physiology if not frank HRS (unlikely to definitively diagnose this in the setting of SBP).  Continuing supportive CRRT with UF and expectations transition to comfort care if we do not have any meaningful recovery possibly in the next 24 hours or so.  Daughter updated at bedside. 2.  Anion gap metabolic acidosis: From AKI/lactic acidosis of shock-corrected with CRRT. 3.  Acute metabolic encephalopathy: Suspected multiple etiologies including hepatic encephalopathy and uremia.  Continue CRRT. 4.  Hyponatremia: Secondary to acute kidney injury as well as underlying cirrhosis/chronic alcohol use.    Activated ADH with disparity between urine osmolality/serum osmolality. 5.  Acute respiratory failure: Secondary to altered mental status/inability to protect airway.  Status post intubation. 6.    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  Subjective:   Noted to have had bradycardia overnight.   Objective:   BP (!) 106/52   Pulse 62   Temp 97.8 F (36.6 C) (Axillary)   Resp 12   Ht '6\' 4"'$  (1.93 m)   Wt 107.4 kg   SpO2 98%   BMI 28.82 kg/m   Intake/Output Summary (Last 24 hours) at 08/05/2018 1120 Last data filed at 08/05/2018 1100 Gross per 24 hour  Intake 3602.53 ml  Output 4932 ml  Net -1329.47 ml   Weight change: -1.4 kg  Physical Exam: Gen: Intubated, unresponsive, icteric CVS: Pulse regular rhythm, normal rate, S1 and S2 with ejection systolic murmur Resp: Anteriorly clear to auscultation, no rales/rhonchi Abd: Soft, moderate global distention, bowel sounds scant Ext: 1-2+ lower extremity edema  Imaging: Dg Abd Portable 1v  Result Date: 07/14/2018 CLINICAL DATA:  Check gastric catheter placement EXAM:  PORTABLE ABDOMEN - 1 VIEW COMPARISON:  None. FINDINGS: Gastric catheter is noted coiled within the stomach with the tip near the pylorus. No obstructive changes are seen. No free air is seen. IMPRESSION: Gastric catheter within the distal stomach. Electronically Signed   By: Inez Catalina M.D.   On: 07/29/2018 17:24    Labs: BMET Recent Labs  Lab 07/12/2018 1834  08/04/2018 2239 07/13/2018 0320 08/10/2018 0748 08/11/2018 1211 07/24/2018 1721 07/22/2018 2029 08/04/18 0415 08/04/18 1511 08/05/18 0345  NA 120*   < > 127* 129* 124*  --  132*  132* 132* 133* 134*  134* 137  K >7.5*   < > 4.0 3.8 >7.5* 3.8 3.9  3.9 3.9 4.0 4.3  4.3 4.0  CL 85*   < > 86* 88* 89*  --  95*  95*  --  100 100  100 101  CO2 16*   < > 14* 17* 17*  --  21*  20*  --  '23 23  23 25  '$ GLUCOSE 85   < > 91 95 82  --  99  100*  --  105* 114*  113* 137*  BUN 70*   < > 75* 65* 59*  --  47*  46*  --  39* 34*  34* 30*  CREATININE 6.45*   < > 6.84* 5.89* 5.43*  --  4.49*  4.35*  --  3.74* 3.35*  3.31* 2.59*  CALCIUM 7.3*   < > 8.2* 8.0* 7.2*  --  8.2*  8.2*  --  8.1* 8.4*  8.3* 8.5*  PHOS 11.3*  --   --  7.4*  --   --  5.6*  --  5.1* 5.5*  --    < > = values in this interval not displayed.   CBC Recent Labs  Lab 08/07/2018 1041  07/18/2018 0320  07/23/2018 1721 07/14/2018 2029 08/04/18 0415 08/05/18 0345  WBC 20.1*  --  13.2*  --  9.3  --  8.3 10.9*  NEUTROABS 16.6*  --   --   --   --   --   --   --   HGB 10.0*   < > 7.0*   < > 9.2* 11.2* 9.0* 8.6*  HCT 28.8*   < > 19.0*   < > 24.6* 33.0* 24.5* 25.0*  MCV 99.0  --  96.0  --  93.2  --  95.3 98.8  PLT 85*  --  66*  --  PLATELET CLUMPS NOTED ON SMEAR, UNABLE TO ESTIMATE  --  42* 45*   < > = values in this interval not displayed.    Medications:    . B-complex with vitamin C  1 tablet Per Tube Daily  . chlorhexidine gluconate (MEDLINE KIT)  15 mL Mouth Rinse BID  . feeding supplement (PRO-STAT SUGAR FREE 64)  30 mL Oral 5 X Daily  . folic acid  1 mg Intravenous Daily   . lactulose  30 g Per Tube TID  . mouth rinse  15 mL Mouth Rinse 10 times per day  . pantoprazole  40 mg Intravenous Q12H  . rifaximin  550 mg Per Tube BID  . thiamine  100 mg Intravenous Daily   Elmarie Shiley, MD 08/05/2018, 11:20 AM

## 2018-08-05 NOTE — Progress Notes (Signed)
Subjective: Bradycardic overnight.  Objective: Vital signs in last 24 hours: Temp:  [94 F (34.4 C)-98.9 F (37.2 C)] 98.9 F (37.2 C) (01/25 0409) Pulse Rate:  [46-100] 59 (01/25 0700) Resp:  [11-18] 13 (01/25 0700) BP: (92-117)/(45-65) 104/50 (01/25 0700) SpO2:  [95 %-100 %] 98 % (01/25 0700) FiO2 (%):  [40 %] 40 % (01/25 0400) Weight:  [107.4 kg] 107.4 kg (01/25 0500) Last BM Date: (PTA)  Intake/Output from previous day: 01/24 0701 - 01/25 0700 In: 3383.6 [I.V.:2031.6; NG/GT:646; IV Piggyback:600] Out: 9449 [Urine:50; Emesis/NG output:50] Intake/Output this shift: No intake/output data recorded.  General appearance: intubated GI: soft, + ascites Extremities: edema 2+  Lab Results: Recent Labs    07/12/2018 1721 07/26/2018 2029 08/04/18 0415 08/05/18 0345  WBC 9.3  --  8.3 10.9*  HGB 9.2* 11.2* 9.0* 8.6*  HCT 24.6* 33.0* 24.5* 25.0*  PLT PLATELET CLUMPS NOTED ON SMEAR, UNABLE TO ESTIMATE  --  42* 45*   BMET Recent Labs    08/04/18 0415 08/04/18 1511 08/05/18 0345  NA 133* 134*  134* 137  K 4.0 4.3  4.3 4.0  CL 100 100  100 101  CO2 '23 23  23 25  '$ GLUCOSE 105* 114*  113* 137*  BUN 39* 34*  34* 30*  CREATININE 3.74* 3.35*  3.31* 2.59*  CALCIUM 8.1* 8.4*  8.3* 8.5*   LFT Recent Labs    07/14/2018 1211  08/05/18 0345  PROT  --    < > 6.5  ALBUMIN  --    < > 3.2*  AST  --    < > 186*  ALT  --    < > 107*  ALKPHOS  --    < > 83  BILITOT  --    < > 41.8*  BILIDIR 23.2*  --   --    < > = values in this interval not displayed.   PT/INR Recent Labs    08/04/18 0415 08/05/18 0345  LABPROT 43.6* 45.1*  INR 4.72* 4.92*   Hepatitis Panel No results for input(s): HEPBSAG, HCVAB, HEPAIGM, HEPBIGM in the last 72 hours. C-Diff No results for input(s): CDIFFTOX in the last 72 hours. Fecal Lactopherrin No results for input(s): FECLLACTOFRN in the last 72 hours.  Studies/Results: Dg Abd Portable 1v  Result Date: 07/24/2018 CLINICAL DATA:  Check  gastric catheter placement EXAM: PORTABLE ABDOMEN - 1 VIEW COMPARISON:  None. FINDINGS: Gastric catheter is noted coiled within the stomach with the tip near the pylorus. No obstructive changes are seen. No free air is seen. IMPRESSION: Gastric catheter within the distal stomach. Electronically Signed   By: Inez Catalina M.D.   On: 07/13/2018 17:24    Medications:  Scheduled: . B-complex with vitamin C  1 tablet Per Tube Daily  . chlorhexidine gluconate (MEDLINE KIT)  15 mL Mouth Rinse BID  . feeding supplement (PRO-STAT SUGAR FREE 64)  30 mL Oral 5 X Daily  . folic acid  1 mg Intravenous Daily  . lactulose  30 g Per Tube TID  . mouth rinse  15 mL Mouth Rinse 10 times per day  . pantoprazole  40 mg Intravenous Q12H  . rifaximin  550 mg Per Tube BID  . thiamine  100 mg Intravenous Daily   Continuous: .  prismasol BGK 4/2.5 400 mL/hr at 08/05/18 0543  .  prismasol BGK 4/2.5 200 mL/hr at 08/05/18 0546  . sodium chloride 10 mL/hr at 08/05/18 0700  . albumin human Stopped (08/05/18 0654)  .  dexmedetomidine (PRECEDEX) IV infusion Stopped (08/04/18 2329)  . feeding supplement (VITAL 1.5 CAL) 1,000 mL (08/04/18 1412)  . fentaNYL infusion INTRAVENOUS 250 mcg/hr (08/05/18 0700)  . norepinephrine (LEVOPHED) Adult infusion 20 mcg/min (08/05/18 0700)  . octreotide  (SANDOSTATIN)    IV infusion 50 mcg/hr (08/05/18 0700)  . piperacillin-tazobactam Stopped (08/05/18 9507)  . prismasol BGK 4/2.5 2,500 mL/hr at 08/05/18 0544  . vasopressin (PITRESSIN) infusion - *FOR SHOCK* 0.03 Units/min (08/05/18 0700)    Assessment/Plan: 1) ESLD with liver failure. 2) SBP. 3) Renal failure. 4) Respiratory arrest.   His INR has mildly worsened, but again, no significant change from the prior 48 hours.  His TB has increased, but it is not clear about the rapid change over the past 48 hours.  Most likely it was still in the 40 range and transiently improved with CRRT.  He was reported to be bradycardic overnight.   Overall there is no meaningful improvement.  At this point there is no further GI recommendations.  His prognosis was poor from the time of admission.  Plan: 1) Continue with supportive care. 2) Recommend discussions with the family about comfort care. 3) Signing off.  LOS: 3 days   Keturah Yerby D 08/05/2018, 7:13 AM

## 2018-08-05 NOTE — Progress Notes (Signed)
CRITICAL VALUE ALERT  Critical Value:  TBilli 41.8  Date & Time Notied:  08/05/2018 0555  Provider Notified: Pola Corn  Orders Received/Actions taken: no new orders at this time

## 2018-08-06 ENCOUNTER — Inpatient Hospital Stay (HOSPITAL_COMMUNITY): Payer: Medicaid Other

## 2018-08-06 ENCOUNTER — Encounter (HOSPITAL_COMMUNITY): Payer: Self-pay | Admitting: Gastroenterology

## 2018-08-06 LAB — RENAL FUNCTION PANEL
Albumin: 3.8 g/dL (ref 3.5–5.0)
Anion gap: 9 (ref 5–15)
BUN: 23 mg/dL — ABNORMAL HIGH (ref 6–20)
CHLORIDE: 103 mmol/L (ref 98–111)
CO2: 26 mmol/L (ref 22–32)
Calcium: 8.9 mg/dL (ref 8.9–10.3)
Creatinine, Ser: 2.57 mg/dL — ABNORMAL HIGH (ref 0.61–1.24)
GFR calc Af Amer: 31 mL/min — ABNORMAL LOW (ref >60)
GFR calc non Af Amer: 27 mL/min — ABNORMAL LOW (ref >60)
Glucose, Bld: 100 mg/dL — ABNORMAL HIGH (ref 70–99)
Phosphorus: 4.9 mg/dL — ABNORMAL HIGH (ref 2.5–4.6)
Potassium: 4.2 mmol/L (ref 3.5–5.1)
Sodium: 138 mmol/L (ref 135–145)

## 2018-08-06 LAB — BODY FLUID CULTURE: CULTURE: NO GROWTH

## 2018-08-06 LAB — CBC
HCT: 22.4 % — ABNORMAL LOW (ref 39.0–52.0)
Hemoglobin: 7.6 g/dL — ABNORMAL LOW (ref 13.0–17.0)
MCH: 34.7 pg — ABNORMAL HIGH (ref 26.0–34.0)
MCHC: 33.9 g/dL (ref 30.0–36.0)
MCV: 102.3 fL — ABNORMAL HIGH (ref 80.0–100.0)
PLATELETS: 37 10*3/uL — AB (ref 150–400)
RBC: 2.19 MIL/uL — ABNORMAL LOW (ref 4.22–5.81)
RDW: 16.7 % — ABNORMAL HIGH (ref 11.5–15.5)
WBC: 10 10*3/uL (ref 4.0–10.5)
nRBC: 0 % (ref 0.0–0.2)

## 2018-08-06 LAB — GLUCOSE, CAPILLARY
GLUCOSE-CAPILLARY: 86 mg/dL (ref 70–99)
Glucose-Capillary: 100 mg/dL — ABNORMAL HIGH (ref 70–99)
Glucose-Capillary: 105 mg/dL — ABNORMAL HIGH (ref 70–99)

## 2018-08-06 LAB — HEPATIC FUNCTION PANEL
ALT: 91 U/L — ABNORMAL HIGH (ref 0–44)
AST: 141 U/L — ABNORMAL HIGH (ref 15–41)
Albumin: 3.7 g/dL (ref 3.5–5.0)
Alkaline Phosphatase: 69 U/L (ref 38–126)
BILIRUBIN DIRECT: 27.6 mg/dL — AB (ref 0.0–0.2)
Indirect Bilirubin: 19 mg/dL — ABNORMAL HIGH (ref 0.3–0.9)
Total Bilirubin: 46.6 mg/dL (ref 0.3–1.2)
Total Protein: 6.9 g/dL (ref 6.5–8.1)

## 2018-08-06 LAB — PROTIME-INR
INR: 4.5
Prothrombin Time: 42 seconds — ABNORMAL HIGH (ref 11.4–15.2)

## 2018-08-06 LAB — MAGNESIUM: Magnesium: 2.7 mg/dL — ABNORMAL HIGH (ref 1.7–2.4)

## 2018-08-06 MED ORDER — FENTANYL BOLUS VIA INFUSION
100.0000 ug | INTRAVENOUS | Status: DC | PRN
  Administered 2018-08-06 (×4): 100 ug via INTRAVENOUS
  Filled 2018-08-06: qty 100

## 2018-08-06 MED ORDER — FENTANYL CITRATE (PF) 100 MCG/2ML IJ SOLN: 50.0000 ug | INTRAMUSCULAR | Status: DC | PRN

## 2018-08-06 MED ORDER — GLYCOPYRROLATE 0.2 MG/ML IJ SOLN: 0.2000 mg | INTRAMUSCULAR | Status: DC | PRN

## 2018-08-06 MED ORDER — HEPARIN SODIUM (PORCINE) 1000 UNIT/ML IJ SOLN
4000.0000 [IU] | Freq: Once | INTRAMUSCULAR | Status: AC
  Administered 2018-08-06: 4000 [IU] via INTRAVENOUS

## 2018-08-06 MED ORDER — DEXTROSE 5 % IV SOLN: INTRAVENOUS | Status: DC

## 2018-08-06 MED ORDER — GLYCOPYRROLATE 1 MG PO TABS: 1.0000 mg | ORAL_TABLET | ORAL | Status: DC | PRN

## 2018-08-06 MED ORDER — DIPHENHYDRAMINE HCL 50 MG/ML IJ SOLN: 25.0000 mg | INTRAMUSCULAR | Status: DC | PRN

## 2018-08-06 MED ORDER — POLYVINYL ALCOHOL 1.4 % OP SOLN
1.0000 [drp] | Freq: Four times a day (QID) | OPHTHALMIC | Status: DC | PRN
  Filled 2018-08-06: qty 15

## 2018-08-06 MED ORDER — FENTANYL 2500MCG IN NS 250ML (10MCG/ML) PREMIX INFUSION
0.0000 ug/h | INTRAVENOUS | Status: DC
  Administered 2018-08-06: 400 ug/h via INTRAVENOUS
  Filled 2018-08-06: qty 250

## 2018-08-06 MED ORDER — ACETAMINOPHEN 325 MG PO TABS: 650.0000 mg | ORAL_TABLET | Freq: Four times a day (QID) | ORAL | Status: DC | PRN

## 2018-08-06 MED ORDER — LORAZEPAM 2 MG/ML IJ SOLN
2.0000 mg | INTRAMUSCULAR | Status: DC | PRN
  Administered 2018-08-06: 4 mg via INTRAVENOUS
  Filled 2018-08-06: qty 2

## 2018-08-06 MED ORDER — CHLORHEXIDINE GLUCONATE 0.12 % MT SOLN
OROMUCOSAL | Status: AC
  Filled 2018-08-06: qty 15

## 2018-08-06 MED ORDER — ACETAMINOPHEN 650 MG RE SUPP: 650.0000 mg | Freq: Four times a day (QID) | RECTAL | Status: DC | PRN

## 2018-08-07 LAB — CULTURE, BLOOD (ROUTINE X 2)
CULTURE: NO GROWTH
Culture: NO GROWTH
Culture: NO GROWTH
Culture: NO GROWTH
SPECIAL REQUESTS: ADEQUATE
Special Requests: ADEQUATE
Special Requests: ADEQUATE
Special Requests: ADEQUATE

## 2018-08-11 ENCOUNTER — Telehealth: Payer: Self-pay | Admitting: Pulmonary Disease

## 2018-08-11 NOTE — Telephone Encounter (Incomplete)
08/11/2018 Received D/C from [facility] home will send to Dr. Craige Cotta @ Pulmonary to sign on Monday 08/13/18 . This is for a Burial and to be mailed to [facility] [employer] [facility] [employer] when completed/ PWR

## 2018-08-12 NOTE — Progress Notes (Signed)
Wasted 225 of fentanyl gtt with Silvestre Gunner.

## 2018-08-12 NOTE — Accreditation Note (Signed)
o Restraints not reported to CMS Pursuant to regulation 482.13 (G) (3) use of soft wrist restraints was logged 08/09/2018 @ 1600.

## 2018-08-12 NOTE — Progress Notes (Addendum)
NAME:  Jacob Mullen, MRN:  [id number], DOB:  1965-11-20, LOS: 4 ADMISSION DATE:  07/28/2018, CONSULTATION DATE:  1/22 REFERRING MD:  Jacob Mullen, CHIEF COMPLAINT:  Acute metabolic encephaloapthy    Brief History   69 yom w/ sig h/o ETOH, recently identified elevated LFTs and bilirubin w/ [place] raising concern for chronic hepatocellular disease & cirrhosis as well as distended GB w/out gallstones, biliary dilation or wall thickening. Admitted acutely on 1/22 after being found by mother encephalopathic. Lab findings showing: ammonia 324, INR 3.08, new renal failure w/ cr 5.64, rising t-bilirubin from 26.8 to 40.7 and increased lipase. PCCM asked to admit given multiple metabolic derangements and hepato-renal failure   History of present illness    52 year old male patient with known history of  Presented to [facility] initially on 1/8 with chief complaint of jaundice, marked by yellowing of the eyes, darkening urine and worsening appetite for about a 2-week timeframe.  He has had some progressive weakness.  Admitted to drinking 4 to 512 ounce beers daily, and has reported withdrawal type symptoms and has been admitted for withdrawal in the past.  In the ER he was found to have elevated bilirubin and mildly elevated LFTs and ultrasound of the abdomen was checked, it was negative for obstruction he was instructed to abstain from alcohol, and obtain GI outpatient consultation.  He presents to the emergency room on 1/22 at [facility] with chief complaint of altered mental status.  He apparently continues to drink.  He was apparently found by his mother the a.m. of 1/22 agitated and confused, she did find some dark appearing emesis in the house when she went to pick him up.  Diagnostic evaluation in the emergency room demonstrated the following: Lipase elevated from 45 up to 86, ammonia level 324, AST 98, ALT 54, total bilirubin 40.7 with alk phos of 141, sodium of 122, BUN and creatinine acutely elevated up to 5.64 and  65.  Because of his profound metabolic derangements and acute hepatic and metabolic encephalopathy he was transferred to [facility] for further evaluation and sub-specialty care.  Past Medical History  ETOH   Significant Hospital Events   1/22: Admitted with acute encephalopathy felt metabolic and hepatic in nature, acute renal failure, profound lactic acidosis, ineffective airway management, and profound jaundice.  Consults:  GI medicine 1/22  Procedures:  Oral endotracheal tube 1/22 Central line 1/22 HD catheter 1/22 Paracentesis 1/23  Significant Diagnostic Tests:  Abdominal ultrasound 1/8: Heterogenous hepatic and echogenicity suggesting steatosis versus chronic hepatocellular disease.  Mild micronodular contours of the liver suggesting cirrhosis.  There is trace perihepatic ascites.  The gallbladder was distended, irregular in shape but there were no gallstones or wall thickening there was no biliary dilation 1/22: Lipase 86 up from 45 2 weeks prior, ammonia 324, Acetaminophen less than 10, lactic acid 10.7, INR 3.08, alk phos 141 down from 162, total bilirubin 40.7 up from 26.8, serum creatinine 5.64 CT abdomen pelvis 1/22 Micro Data:  Blood cultures times two 1/22>>Negative Respiratory culture 1/22>>>Negative Ascites 1/22>>>Negative  Antimicrobials:  Zosyn 1/22  Interim history/subjective:  Patient is becoming more agitated. Not responding to any commands.  Objective   Blood pressure (!) 106/53, pulse 65, temperature 98.3 F (36.8 C), temperature source Axillary, resp. rate 14, height '6\' 4"'$  (1.93 m), weight 104.7 kg, SpO2 97 %. CVP:  Myles.Shearing mmHg] 14 mmHg  Vent Mode: PRVC FiO2 (%):  [40 %] 40 % Set Rate:  [12 bmp] 12 bmp Vt Set:  [  600 mL] 600 mL PEEP:  [5 cmH20] 5 cmH20 Plateau Pressure:  [13 cmH20-23 cmH20] 23 cmH20   Intake/Output Summary (Last 24 hours) at 09-05-2018 0837 Last data filed at September 05, 2018 0800 Gross per 24 hour  Intake 4018.01 ml  Output 5146 ml  Net  -1127.99 ml   Filed Weights   08/04/18 0500 08/05/18 0500 05-Sep-2018 0500  Weight: 108.8 kg 107.4 kg 104.7 kg    Examination: General: Jaundiced appearing white male sclera completely icteric, intubated and sedated, is in acute distress HENT: Normocephalic, atraumatic ,scleral  icterous, Lungs: Clear bilaterally with decreased breath sounds at bases. Cardiovascular: Regular rate and rhythm, no murmurs. Abdomen: Distended, firm, shifting dullness. Extremities: Jaundiced appearing, cool,  Pulses palpable Neuro: Sedated, not following any commands. GU: Foley in place.  Resolved Hospital Problem list     Assessment & Plan:   Acute liver failure, superimposed on underlying cirrhosis.  Suspect secondary to acute alcoholic hepatitis. Elevated T-bili. Acute hepatic and toxic/metabolic encephalopathy    also concern about gallbladder distention however no evidence of obstruction or biliary dilation, 1 nonobstructing gallstone on CT. MELD score 40, hepatitis discriminant function score was 170.8 today,  CT abdomen shows liver cirrhosis with moderate ascites and small bilateral pleural effusion more on right.  Contracted gallbladder with 5 mm nonobstructing gallstone. Liver enzymes are not markedly elevated making hepatitis less likely. INR 4.50 today,T Bili trending up 46.6 today, consistent with liver failure. Plan Serial LFTs Serial INR Treat withdrawal and delirium IV hydration Start him on lactulose and rifaximin . Zosyn for SBP  trickle feed. Very poor prognosis, family meeting today at 2.00 PM.  Hematemesis.  EGD shows gastritis and portal hypertensive gastropathy with no variceal bleeding. Switch Protonix GTT with Protonix IV twice daily. -Monitor CBC with transfusion goal of 7.  Acute respiratory failure 2/2 ineffective airway protection and possibly aspiration event.  Plan Full vent. support PAD protocol RASS goal -1  Acute Circulatory shock, r/o sepsis  Plan Transduce  CVP IV hydration for CVP > 10 MAP goal > 65 Pan culture- Negative Empiric abx   Acute renal failure: suspect hepatorenal syndrome but could also be volume depleted.  Plan. Nephrology was consulted- appreciate their recommendations. Patient was placed on CRRT. IV hydration Repeat labs  MAP goal > 70   Severe anion gap metabolic acidosis (lactic acidosis) in setting of shock, w/ renal failure Plan MAP goal > 70 Ck abg Serial chemistries   Fluid and electrolyte imbalance: hyponatremia  Plan Resolved.   Best practice:  Diet: NPO Pain/Anxiety/Delirium protocol (if indicated): PAD protocol precedex and fent  VAP protocol (if indicated): 1/22 DVT prophylaxis: SCD GI prophylaxis: PPI Glucose control: na Mobility: BR Code Status: full code  Family Communication: pending  Disposition: critically ill.  With multiple organ failure this includes renal, liver, respiratory, and circulatory shock.  We have asked both GI and nephrology to assist with his care.  Labs   CBC: Recent Labs  Lab 07/14/2018 1041  08/08/2018 0320  08/05/2018 1721 08/05/2018 2029 08/04/18 0415 08/05/18 0345 2018-09-05 0408  WBC 20.1*  --  13.2*  --  9.3  --  8.3 10.9* 10.0  NEUTROABS 16.6*  --   --   --   --   --   --   --   --   HGB 10.0*   < > 7.0*   < > 9.2* 11.2* 9.0* 8.6* 7.6*  HCT 28.8*   < > 19.0*   < > 24.6* 33.0* 24.5*  25.0* 22.4*  MCV 99.0  --  96.0  --  93.2  --  95.3 98.8 102.3*  PLT 85*  --  66*  --  PLATELET CLUMPS NOTED ON SMEAR, UNABLE TO ESTIMATE  --  42* 45* 37*   < > = values in this interval not displayed.    Basic Metabolic Panel: Recent Labs  Lab 07/30/2018 1834  07/25/2018 0320  07/29/2018 1721  08/04/18 0415 08/04/18 1511 08/05/18 0345 08/05/18 1634 Aug 08, 2018 0408  NA 120*   < > 129*   < > 132*  132*   < > 133* 134*  134* 137 137 138  K >7.5*   < > 3.8   < > 3.9  3.9   < > 4.0 4.3  4.3 4.0 4.2 4.2  CL 85*   < > 88*   < > 95*  95*  --  100 100  100 101 100 103  CO2 16*   < >  17*   < > 21*  20*  --  '23 23  23 25 25 26  '$ GLUCOSE 85   < > 95   < > 99  100*  --  105* 114*  113* 137* 105* 100*  BUN 70*   < > 65*   < > 47*  46*  --  39* 34*  34* 30* 26* 23*  CREATININE 6.45*   < > 5.89*   < > 4.49*  4.35*  --  3.74* 3.35*  3.31* 2.59* 2.53* 2.57*  CALCIUM 7.3*   < > 8.0*   < > 8.2*  8.2*  --  8.1* 8.4*  8.3* 8.5* 8.9 8.9  MG 2.0  --  1.8  --   --   --  2.5*  --  2.9*  --  2.7*  PHOS 11.3*  --  7.4*  --  5.6*  --  5.1* 5.5*  --  4.9* 4.9*   < > = values in this interval not displayed.   GFR: Estimated Creatinine Clearance: 43.7 mL/min (A) (by C-G formula based on SCr of 2.57 mg/dL (H)). Recent Labs  Lab 07/26/2018 1834 07/27/2018 2230  07/20/2018 0748 07/20/2018 1211 08/09/2018 1721 08/04/18 0415 08/05/18 0345 08/08/2018 0408  PROCALCITON 2.70  --   --   --   --   --   --   --   --   WBC  --   --    < >  --   --  9.3 8.3 10.9* 10.0  LATICACIDVEN 8.9* 10.6*  --  8.8* 6.5*  --   --   --   --    < > = values in this interval not displayed.    Liver Function Tests: Recent Labs  Lab 07/15/2018 1721 08/04/18 0415 08/04/18 1511 08/05/18 0345 08/05/18 1634 2018/08/08 0408  AST 166* 195* 193* 186*  --  141*  ALT 81* 99* 105* 107*  --  91*  ALKPHOS 87 91 84 83  --  69  BILITOT 38.4* 38.6* 20.0* 41.8*  --  46.6*  PROT 5.9* 5.8* 6.2* 6.5  --  6.9  ALBUMIN 2.7*  2.7* 2.5* 2.7*  2.7* 3.2* 3.5 3.7  3.8   Recent Labs  Lab 08/08/2018 1041 07/22/2018 1834 07/20/2018 0320  LIPASE 86*  --  291*  AMYLASE  --  331*  --    Recent Labs  Lab 08/08/2018 1010 08/10/2018 0320  AMMONIA 324* 139*    ABG    Component  Value Date/Time   PHART 7.573 (H) 08/11/2018 2029   PCO2ART 27.2 (L) 07/27/2018 2029   PO2ART 229.0 (H) 07/20/2018 2029   HCO3 25.2 07/17/2018 2029   TCO2 26 07/24/2018 2029   ACIDBASEDEF 3.2 (H) 07/21/2018 0900   O2SAT 100.0 07/20/2018 2029     Coagulation Profile: Recent Labs  Lab 07/13/2018 1834 08/11/2018 0748 08/04/18 0415 08/05/18 0345  2018/08/11 0408  INR 3.43 4.95* 4.72* 4.92* 4.50*    Cardiac Enzymes: No results for input(s): CKTOTAL, CKMB, CKMBINDEX, TROPONINI in the last 168 hours.  HbA1C: No results found for: HGBA1C  CBG: Recent Labs  Lab 08/05/18 1239 08/05/18 1611 08/05/18 1954 08/11/2018 0011 08-11-2018 0410  GLUCAP 102* 91 107* 100* 86    Review of Systems:   Not able   Past Medical History  He,  has a past medical history of Hypertension and Thyroid disease.   Surgical History   History reviewed. No pertinent surgical history.   Social History   reports that he has been smoking. He has been smoking about 0.50 packs per day. He does not have any smokeless tobacco history on file. He reports current alcohol use.   Family History   His family history is not on file.   Allergies Allergies  Allergen Reactions  . Codeine Itching     Home Medications  Prior to Admission medications   Medication Sig Start Date End Date Taking? Authorizing Provider  mirtazapine (REMERON) 15 MG tablet Take 2 tablets (30 mg total) by mouth at bedtime. 06/14/18   Shugart, Lissa Hoard, PA-C  traZODone (DESYREL) 50 MG tablet Take 2 tablets (100 mg total) by mouth at bedtime. 05/24/18   Comer Locket, PA-C     Critical care time:      Lorella Nimrod MD PGY3 Pager [phone]  Attending section: Met with family.  Agree to DNR status and comfort measures.  Reviewed process with them.  Chesley Mires, MD [facility] Pulmonary/Critical Care 2018/08/11, 2:35 PM

## 2018-08-12 NOTE — Progress Notes (Signed)
Patient expired at 1638.  Death was declared by myself and Silvestre Gunner.  Patient passed peacefully with his family surrounding him.  Will continue to offer emotional support to family.

## 2018-08-12 NOTE — Progress Notes (Signed)
CRRT stopped and 154 mL of blood was returned as per family's wishes for patient to transition to comfort care.  HD catheter was heparin locked with 1.2 mL of heparin.  Will continue to offer family comfort during this trying time.

## 2018-08-12 NOTE — Death Summary Note (Signed)
Jacob Mullen was a 53 y.o. male smoker admitted on August 03, 2018 with altered mental status.  He was found to have acute alcoholic hepatitis with progressive jaundice in the setting of alcoholic cirrhosis.  He was also found to have acute renal failure.  He was intubated for airway protection.  He was started on renal replacement therapy.  He was noted to have hematemesis.  EGD showed esophagitis and portal gastropathy.  He was treated with antibiotics for spontaneous bacterial peritonitis.  His clinical status continued to get worse.  Family meeting had on 07/21/2018.  Decision made for DNR status and transition to comfort measures.  He was extubated and expired on 07/13/2018 at 1638.  Final diagnoses: Acute hypoxic respiratory failure Acute alcoholic hepatitis Alcoholic cirrhosis with ascites Jaundice Upper GI bleeding from portal gastropathy and esophagitis Acute renal failure with ATN Hepatorenal syndrome Sepsis from spontaneous bacterial peritonitis Acute metabolic encephalopathy with hepatic coma Aspiration pneumonitis Anion gap metabolic acidosis with lactic acidosis Hyponatremia Anemia of critical illness and chronic disease Thrombocytopenia and coagulopathy in setting of liver cirrhosis  Coralyn Helling, MD [facility] Pulmonary/Critical Care 08/05/2018, 5:41 PM

## 2018-08-12 NOTE — Progress Notes (Signed)
Jacob Mullen is a 53 y.o. male smoker admitted on 07-31-2018 with altered mental status.  He was found to have acute alcoholic hepatitis with progressive jaundice in the setting of alcoholic cirrhosis.  He was also found to have acute renal failure.  He was intubated for airway protection.  He was started on renal replacement therapy.  He was noted to have hematemesis.  EGD showed esophagitis and portal gastropathy.  He was treated with antibiotics for spontaneous bacterial peritonitis.  His clinical status continued to get worse.  Family meeting had on 08/01/2018.  Decision made for DNR status and transition to comfort measures.  BP (!) 104/50   Pulse 62   Temp 98.3 F (36.8 C) (Axillary)   Resp 15   Ht 6\' 4"  (1.93 m)   Wt 104.7 kg   SpO2 97%   BMI 28.10 kg/m   General - obtunded Eyes - jaundice ENT - ETT in place Cardiac - regular rate/rhythm, no murmur Chest - decreased BS Abdomen - distended Extremities - 2+ edema Skin - jaundiced Neuro - doesn't follow commands  Assessment: Acute hypoxic respiratory failure Acute alcoholic hepatitis Alcoholic cirrhosis with ascites Jaundice Upper GI bleeding from portal gastropathy and esophagitis Acute renal failure with ATN Hepatorenal syndrome Sepsis from spontaneous bacterial peritonitis Acute metabolic encephalopathy with hepatic coma Aspiration pneumonitis Anion gap metabolic acidosis with lactic acidosis Hyponatremia Anemia of critical illness and chronic disease Thrombocytopenia and coagulopathy in setting of liver cirrhosis  Plan: DNR D/c CRRT No further lab or image testing Transition to comfort measures  Coralyn HellingVineet Savvy Peeters, MD [facility] Pulmonary/Critical Care 07/21/2018, 2:44 PM

## 2018-08-12 NOTE — Procedures (Signed)
Extubation Procedure Note  Patient Details:   Name: Jacob Mullen DOB: September 12, 1965 MRN: [id number]   Airway Documentation:    Vent end date: 2018-08-20 Vent end time: 1540   Evaluation  O2 sats: stable throughout Complications: No apparent complications Patient did tolerate procedure well. Bilateral Breath Sounds: Rhonchi   No   Patient was extubated to room air per comfort care order. RN was at the bedside with RT during extubation and family was waiting outside of the room for extubation to be complete.   Darolyn Rua 20-Aug-2018, 3:48 PM

## 2018-08-12 NOTE — Progress Notes (Signed)
Patient ID: Jacob Mullen, male   DOB: 21-Jan-1966, 53 y.o.   MRN: [id number] [facility] Progress Note   Assessment/ Plan:   1.  Acute kidney injury:  Likely with hepatorenal physiology if not frank HRS (unlikely to definitively diagnose this in the setting of SBP).  He has been on CRRT now for about 72-96 hours without evidence of renal recovery due to irreversibility of his underlying liver injury.  I have discussed at length with the family and will discontinue CRRT at the time of filter change today as I recommend transitioning to comfort measures at this point. 2.  Anion gap metabolic acidosis: From AKI/lactic acidosis of shock-corrected with CRRT. 3.  Acute metabolic encephalopathy: Suspected multiple etiologies including hepatic encephalopathy and uremia.  Will discontinue CRRT with anticipation of transitioning to comfort measures. 4.  Hyponatremia: Secondary to acute kidney injury as well as underlying cirrhosis/chronic alcohol use.  Corrected with CRRT/water deprivation. 5.  Acute respiratory failure: Secondary to altered mental status/inability to protect airway.    Anticipate terminal extubation. 6.    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  Subjective:   No discernible clinical improvement overnight.  Daughter at bedside updated and well aware of poor prognosis.   Objective:   BP (!) 107/51   Pulse 65   Temp 98.3 F (36.8 C) (Axillary)   Resp 14   Ht '6\' 4"'$  (1.93 m)   Wt 104.7 kg   SpO2 96%   BMI 28.10 kg/m   Intake/Output Summary (Last 24 hours) at 2018/08/13 3810 Last data filed at 08/13/2018 0900 Gross per 24 hour  Intake 4017.44 ml  Output 4984 ml  Net -966.56 ml   Weight change: -2.7 kg  Physical Exam: Gen: Intubated, unresponsive, icteric CVS: Pulse regular rhythm, normal rate, S1 and S2 with  ejection systolic murmur Resp: Anteriorly clear to auscultation, no rales/rhonchi Abd: Soft, moderate global distention, bowel sounds scant Ext: Trace- 1+ lower extremity edema  Imaging: Dg Chest Port 1 View  Result Date: August 13, 2018 CLINICAL DATA:  Respiratory failure EXAM: PORTABLE CHEST 1 VIEW COMPARISON:  Three days ago FINDINGS: Endotracheal tube tip at the clavicular heads. Bilateral central line with tips at the SVC. The orogastric tube reaches the stomach. Worsening aeration behind the heart. There is low lung volumes with hazy opacity at the bases likely from atelectasis and possibly from small volume pleural fluid. Stable heart size distorted by leftward rotation. No pneumothorax. IMPRESSION: 1. Stable hardware positioning. 2. Low volumes with presumed atelectasis at the bases. Worsening left lower lobe aeration compared yesterday. Electronically Signed   By: Monte Fantasia M.D.   On: Aug 13, 2018 06:41    Labs: BMET Recent Labs  Lab 08/11/2018 1834  08/11/2018 0320 08/01/2018 0748  07/28/2018 1721 08/01/2018 2029 08/04/18 0415 08/04/18 1511 08/05/18 0345 08/05/18 1634 Aug 13, 2018 0408  NA 120*   < > 129* 124*  --  132*  132* 132* 133* 134*  134* 137 137 138  K >7.5*   < > 3.8 >7.5*   < > 3.9  3.9 3.9 4.0 4.3  4.3 4.0 4.2 4.2  CL 85*   < > 88* 89*  --  95*  95*  --  100 100  100 101 100 103  CO2 16*   < > 17* 17*  --  21*  20*  --  '23 23  23 '$ 25  25 26  GLUCOSE 85   < > 95 82  --  99  100*  --  105* 114*  113* 137* 105* 100*  BUN 70*   < > 65* 59*  --  47*  46*  --  39* 34*  34* 30* 26* 23*  CREATININE 6.45*   < > 5.89* 5.43*  --  4.49*  4.35*  --  3.74* 3.35*  3.31* 2.59* 2.53* 2.57*  CALCIUM 7.3*   < > 8.0* 7.2*  --  8.2*  8.2*  --  8.1* 8.4*  8.3* 8.5* 8.9 8.9  PHOS 11.3*  --  7.4*  --   --  5.6*  --  5.1* 5.5*  --  4.9* 4.9*   < > = values in this interval not displayed.   CBC Recent Labs  Lab 07/15/2018 1041  08/01/2018 1721 08/01/2018 2029 08/04/18 0415  08/05/18 0345 2018-08-28 0408  WBC 20.1*   < > 9.3  --  8.3 10.9* 10.0  NEUTROABS 16.6*  --   --   --   --   --   --   HGB 10.0*   < > 9.2* 11.2* 9.0* 8.6* 7.6*  HCT 28.8*   < > 24.6* 33.0* 24.5* 25.0* 22.4*  MCV 99.0   < > 93.2  --  95.3 98.8 102.3*  PLT 85*   < > PLATELET CLUMPS NOTED ON SMEAR, UNABLE TO ESTIMATE  --  42* 45* 37*   < > = values in this interval not displayed.    Medications:    . B-complex with vitamin C  1 tablet Per Tube Daily  . chlorhexidine gluconate (MEDLINE KIT)  15 mL Mouth Rinse BID  . feeding supplement (PRO-STAT SUGAR FREE 64)  30 mL Oral 5 X Daily  . folic acid  1 mg Intravenous Daily  . lactulose  30 g Per Tube TID  . mouth rinse  15 mL Mouth Rinse 10 times per day  . pantoprazole  40 mg Intravenous Q12H  . rifaximin  550 mg Per Tube BID  . thiamine  100 mg Intravenous Daily   Elmarie Shiley, MD 08/28/2018, 9:04 AM

## 2018-08-12 DEATH — deceased

## 2018-08-16 NOTE — Telephone Encounter (Signed)
Received signed D/C-D/C mailed to [facility] as requested.

## 2020-07-24 IMAGING — DX DG CHEST 1V PORT
1 series · 1 of 1 positions shown · non-contrast
Comparison: None.

CLINICAL DATA: 52-year-old male status post intubation and central
line placement

EXAM:
PORTABLE CHEST 1 VIEW

[chest ap]
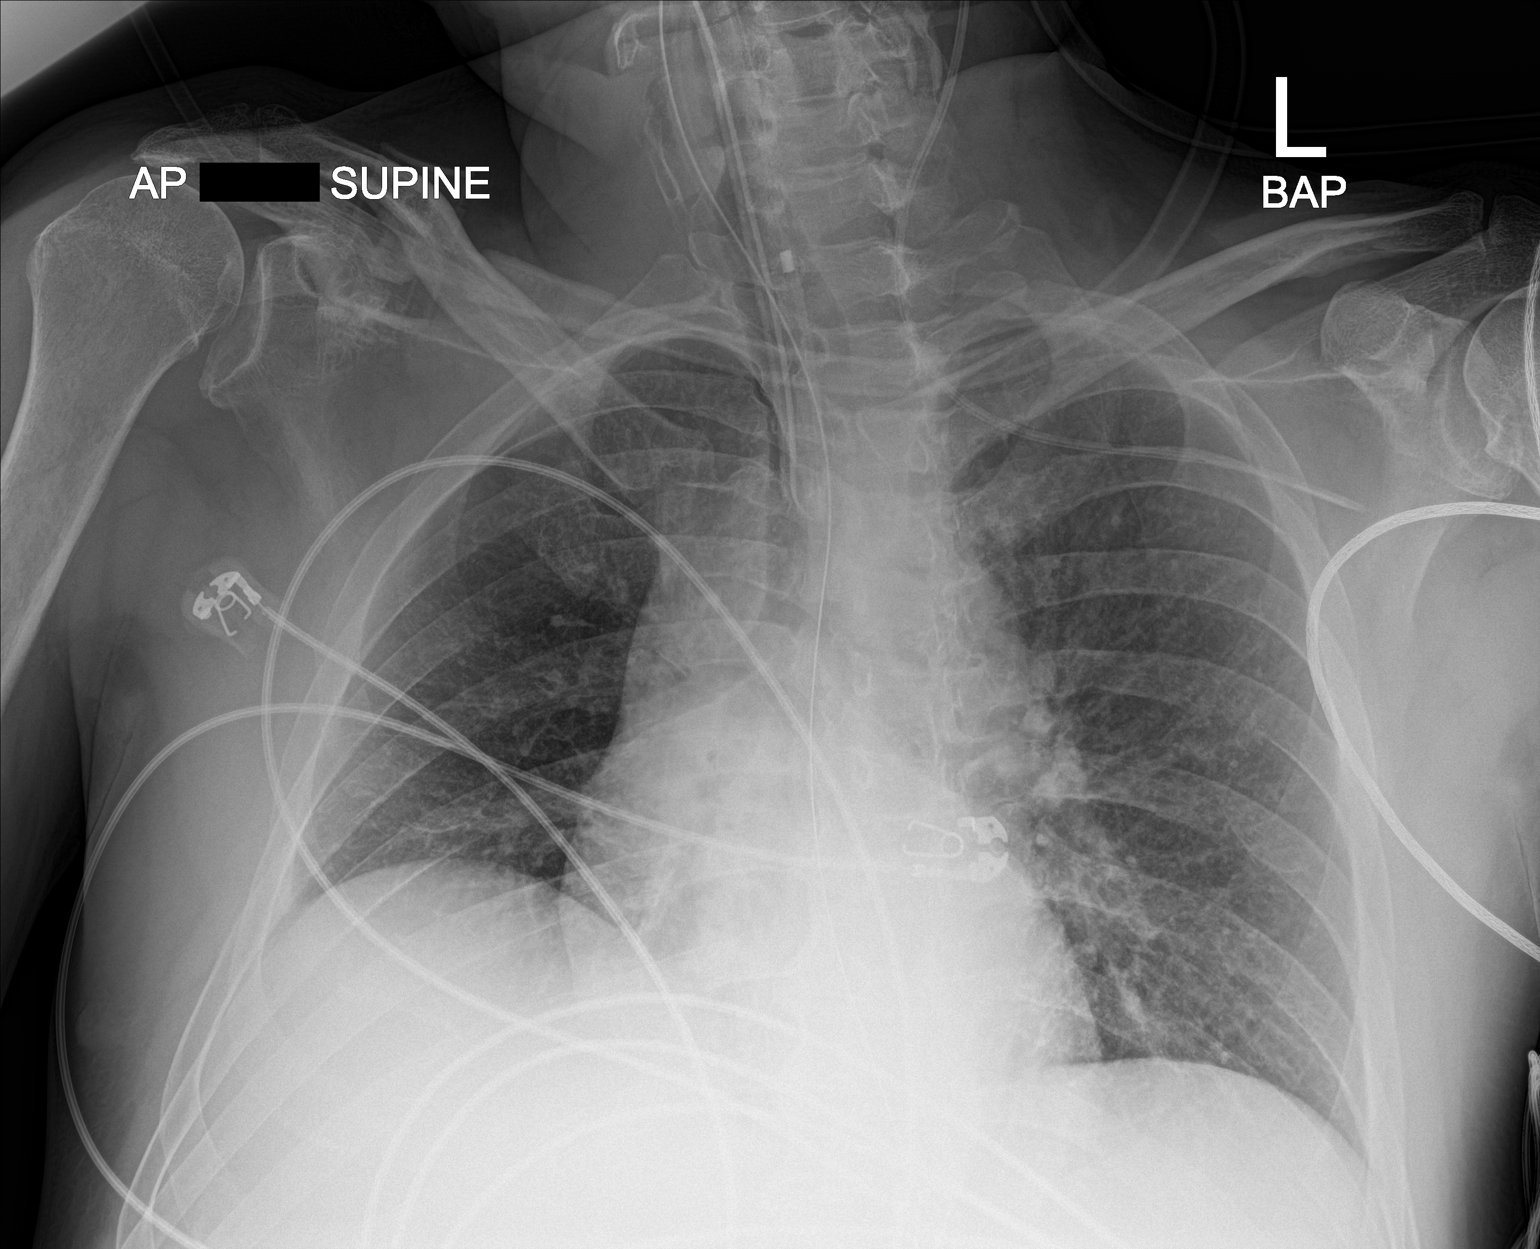

[1 of 1 positions shown; findings below may reference images not displayed]

FINDINGS: The patient is intubated. The tip of the endotracheal tube is well
positioned 3.3 cm above the carina. A gastric tube is present, the
tip of the tube lies inferior to the diaphragm, off the field of
view but presumably in the stomach. A left IJ approach central
venous catheter has been placed. The tip of the catheter deflects
laterally rather than medially and overlies the scapula, presumably
in the axillary vein.

No evidence of pneumothorax. Low inspiratory volumes. Minimal
bronchitic changes, likely chronic.
IMPRESSION: 1. The left IJ approach central venous catheter deflects laterally
and overlies the axillary vein.
2. Well-positioned endotracheal tube with the tip 3.3 cm above the
carina.
3. The tip of the gastric tube is inferior to the diaphragm, off the
field of view but presumably in the stomach.
4. No evidence of pneumothorax, pleural effusion or other acute
cardiopulmonary process.
5. Inspiratory volumes are low.

These results were called by telephone at the time of interpretation
on 08/02/2018 at [DATE] to nurse Dawit Dercha , who verbally acknowledged
these results.

## 2020-07-24 IMAGING — DX DG ABD PORTABLE 1V
1 series · 1 of 1 positions shown · non-contrast
Comparison: None.

CLINICAL DATA: Evaluate OG tube placement

EXAM:
PORTABLE ABDOMEN - 1 VIEW

[abdomen kub]
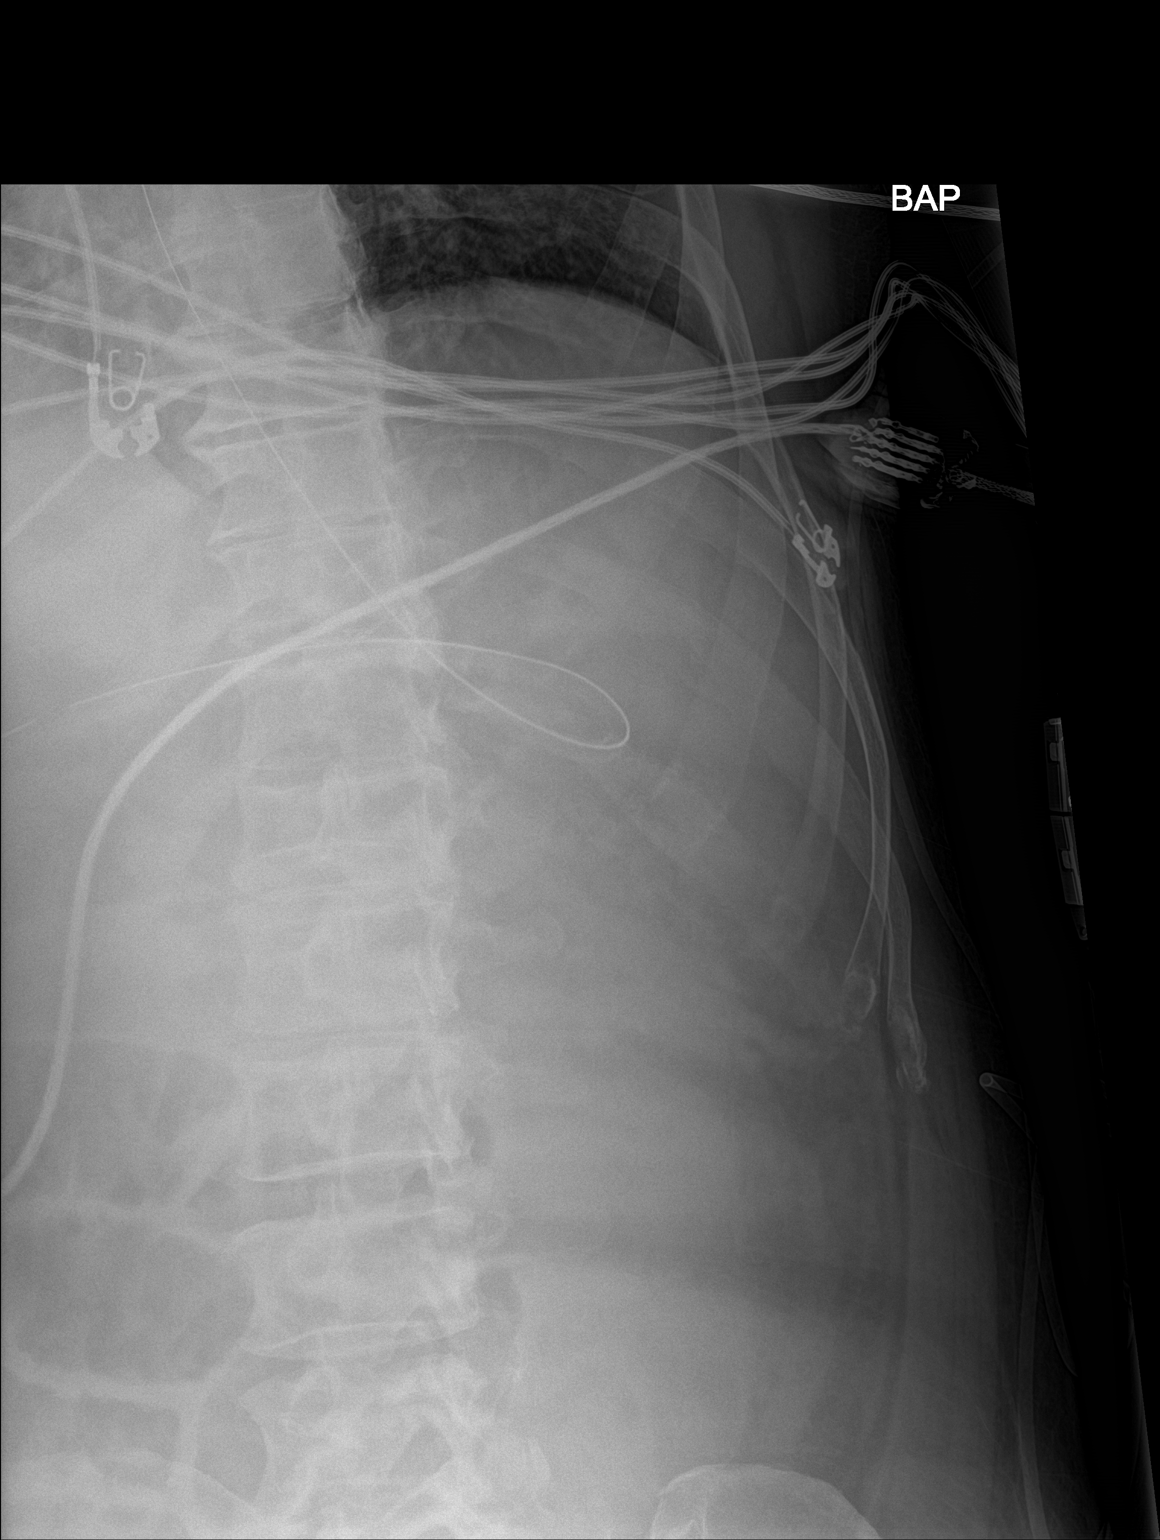

[1 of 1 positions shown; findings below may reference images not displayed]

FINDINGS: The OG tube distal tip terminates to the right of this film. The tip
is likely in the distal stomach or proximal duodenum.
IMPRESSION: The distal tip of the OG tube extends beyond this study as above.
The distal tip is thought to be in the distal stomach or proximal
duodenum.

## 2020-07-24 IMAGING — CT CT ABD-PELV W/O
2 of 4 series · 15 of 46 positions shown, 17 images · non-contrast
Comparison: None.

CLINICAL DATA: Jaundice, worsening of anorexia and progressive
weakness. Abdominal distention and positive alcohol consumption of a
6 pack of Severin per day.

EXAM:
CT ABDOMEN AND PELVIS WITHOUT CONTRAST
TECHNIQUE: Multidetector CT imaging of the abdomen and pelvis was performed
following the standard protocol without IV contrast.

[Series 3: ap without · axial · non-contrast · 0.88mm/px · z∈[+342,+927]mm · 12 of 131 slices shown, 14 images]
[im 7/131  soft-tissue]
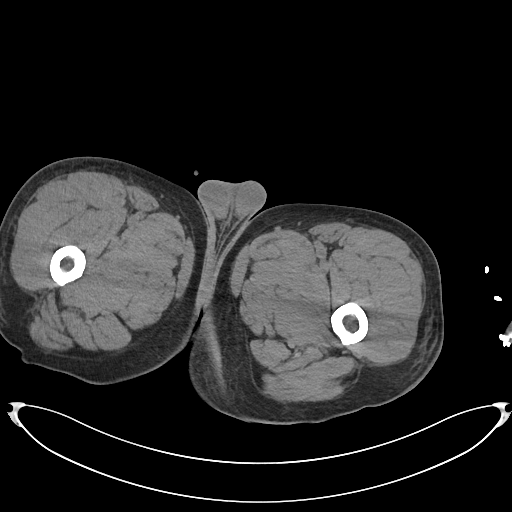
[im 7/131  bone]
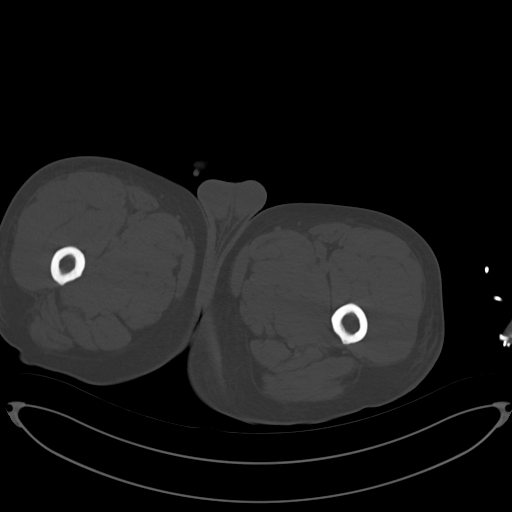
[im 19/131  soft-tissue]
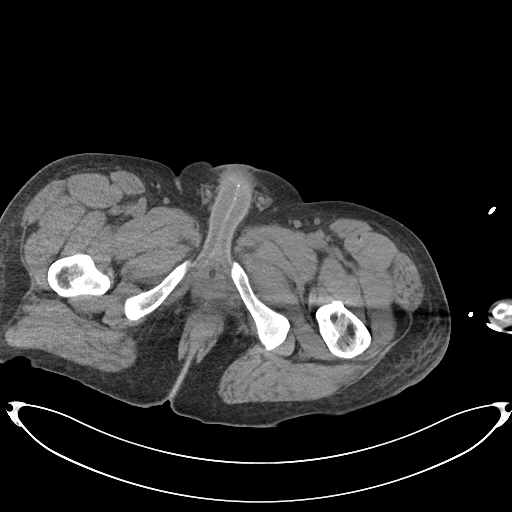
[im 31/131  soft-tissue]
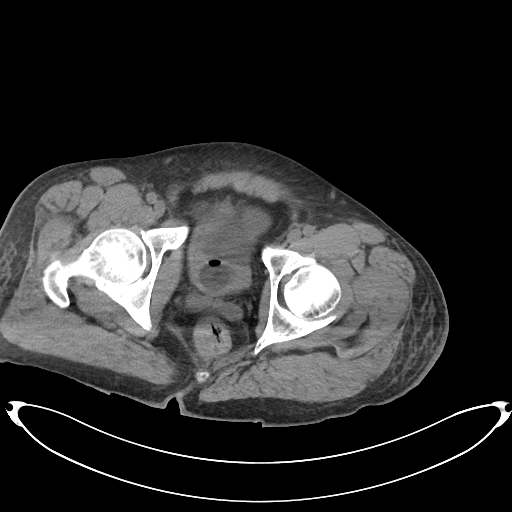
[im 38/131  soft-tissue]
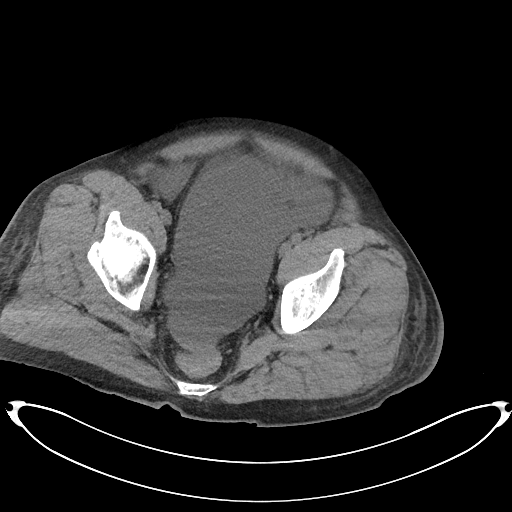
[im 50/131  soft-tissue]
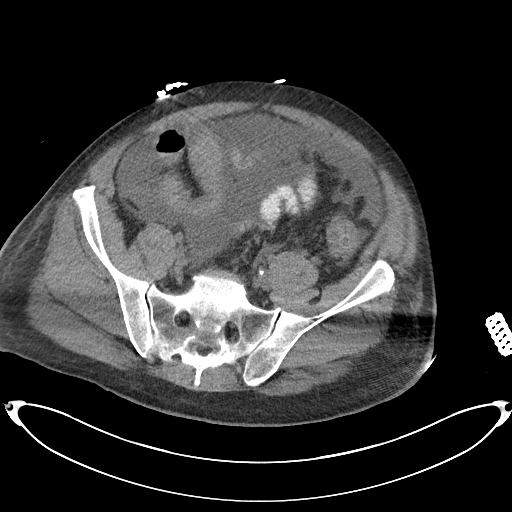
[im 62/131  soft-tissue]
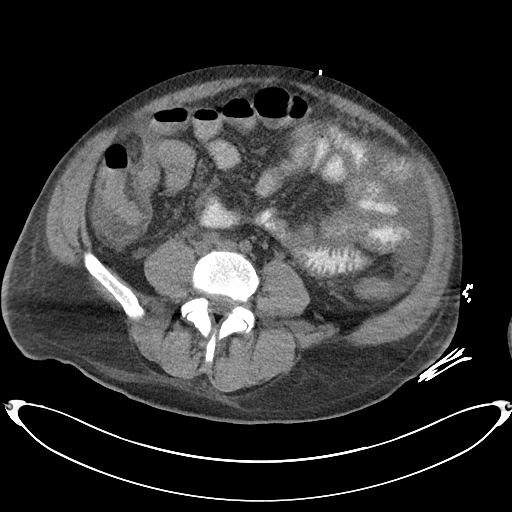
[im 69/131  soft-tissue]
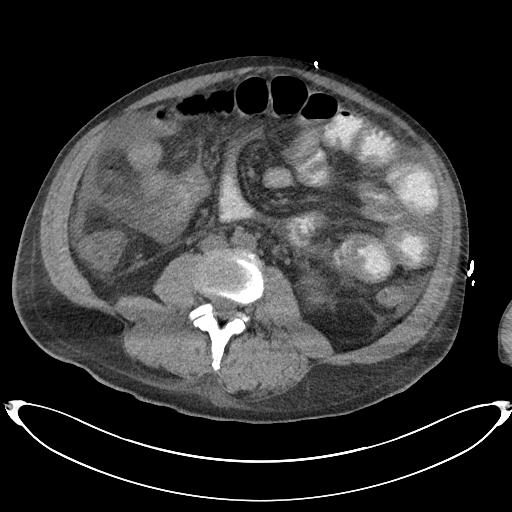
[im 81/131  soft-tissue]
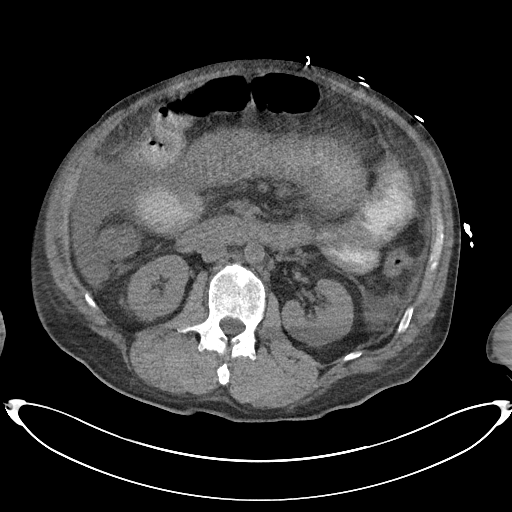
[im 93/131  soft-tissue]
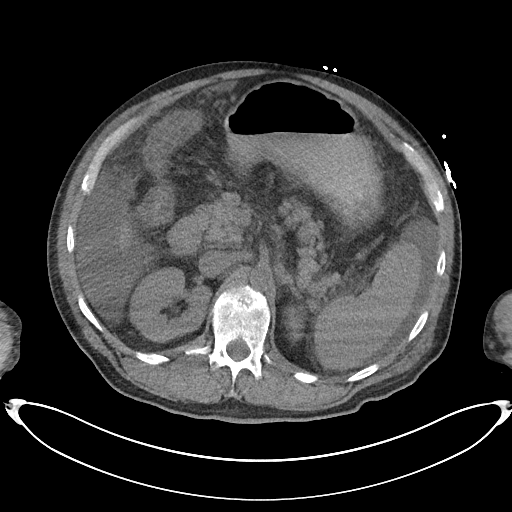
[im 93/131  bone]
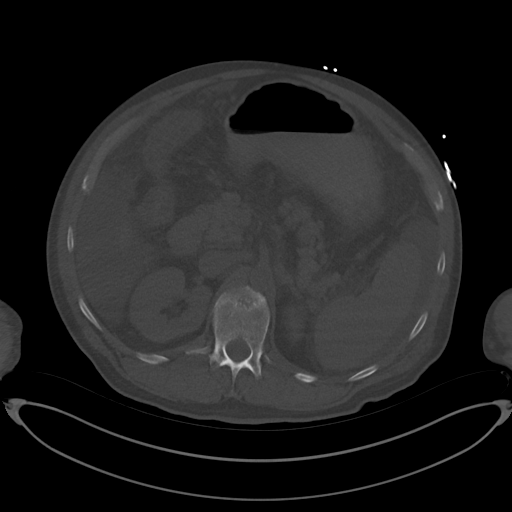
[im 100/131  soft-tissue]
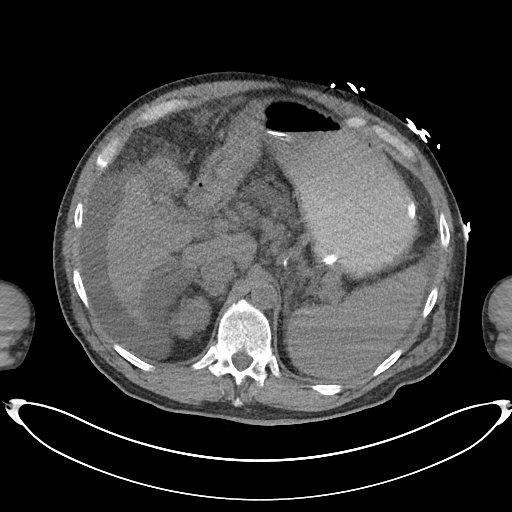
[im 112/131  soft-tissue]
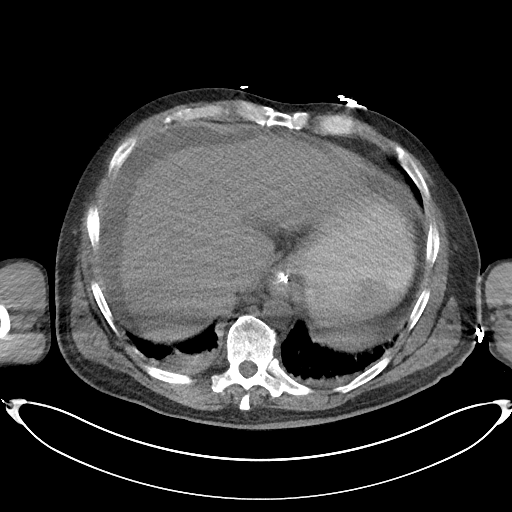
[im 124/131  soft-tissue]
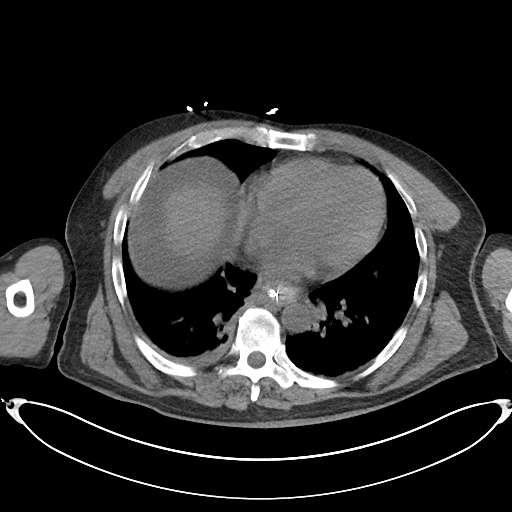

[Series 6: cor · coronal · 0.88mm/px · 3 of 125 slices shown]
[im 42/125  soft-tissue]
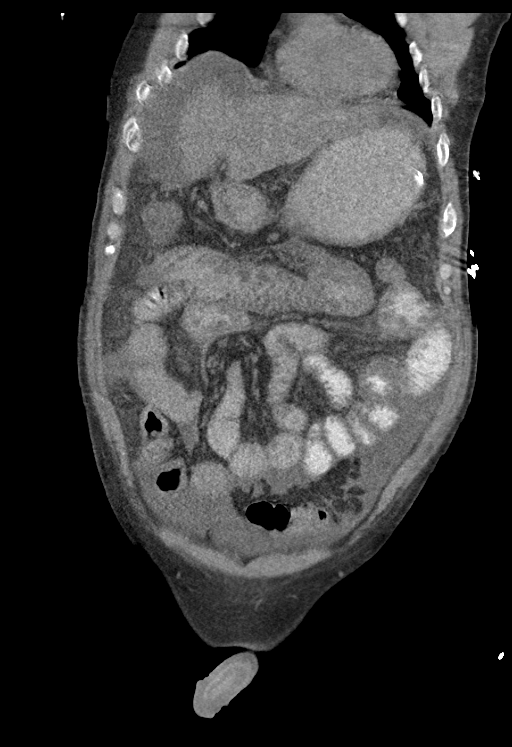
[im 56/125  soft-tissue]
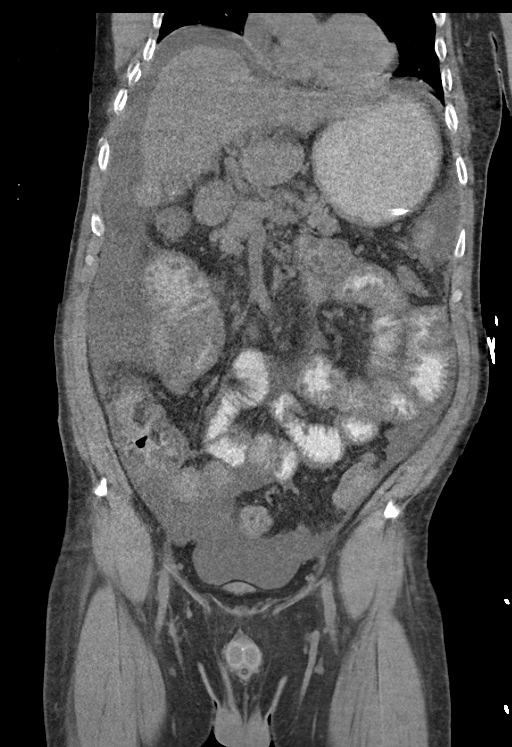
[im 69/125  soft-tissue]
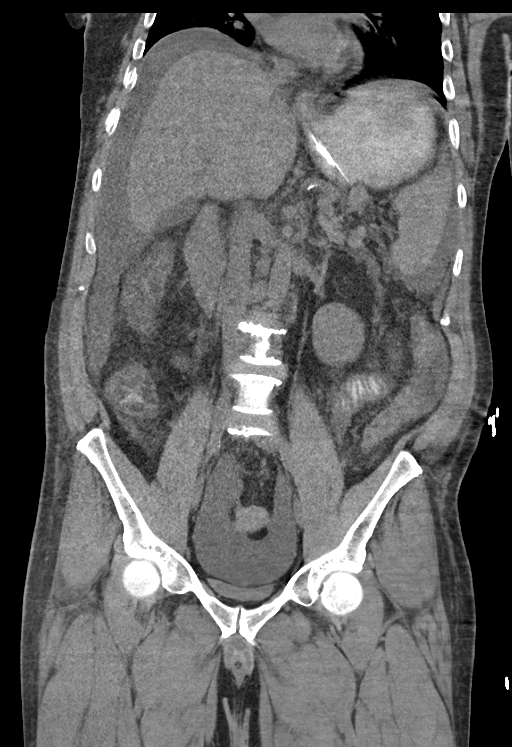

[15 of 46 positions shown; findings below may reference images not displayed]

FINDINGS: Lower chest: Small right and trace left pleural effusions with
atelectasis. Top-normal heart size. Gastric tube noted in the distal
esophagus extending into the stomach.

Hepatobiliary: Cirrhotic appearance of the liver with moderate to
large volume of ascites. No space-occupying mass identified on this
unenhanced study. No biliary dilatation is seen. The the gallbladder
appears contracted and there appears to be a 5 mm calcification
likely representing small nonobstructing gallstone.

Pancreas: The pancreatic gland is unremarkable. No ductal dilatation
or mass. No definite inflammation.

Spleen: No splenomegaly or mass.

Adrenals/Urinary Tract: Normal bilateral adrenal glands. The
unenhanced kidneys are unremarkable. No nephrolithiasis nor
obstructive uropathy. The urinary bladder is decompressed by Foley
catheter.

Stomach/Bowel: Transmural and fold thickening of small bowel loops
without mechanical bowel obstruction. Centralized small bowel loops
due to ascites. Findings may be secondary to hypoproteinemia from
liver disease. Sympathetic thickening is also possibility from the
ascites. The colon is somewhat thickened and contracted in
appearance more so along the right colon. The stomach is distended
with oral contrast and contains a gastric tube with tip in the
gastric antrum.

Vascular/Lymphatic: Nonaneurysmal abdominal aorta. No
lymphadenopathy. Small retroperitoneal and mesenteric lymph nodes
are present however.

Reproductive: Normal size prostate.

Other: Moderate to large volume of ascites as stated.

Musculoskeletal: No acute nor suspicious osseous lesions.
IMPRESSION: 1. Cirrhotic appearance of the liver with moderate to large volume
of ascites.
2. Small right and trace left pleural effusions with atelectasis.
3. Transmural and fold thickening of small bowel loops without
mechanical bowel obstruction. Findings may be secondary to
hypoproteinemia from liver disease. Sympathetic thickening is also
possibility from surrounding ascites. Malabsorption can also a
similar appearance but is believed less likely given the liver
disease.
4. Contracted gallbladder with 5 mm calcification likely
representing a nonobstructing gallstone.
